# Patient Record
Sex: Male | Born: 1990 | Race: White | Hispanic: No | Marital: Single | Smoking: Never smoker
Health system: Southern US, Community
[De-identification: ages and names within clinical notes are randomized; demographics above are authoritative.]

## PROBLEM LIST (undated history)

## (undated) DIAGNOSIS — A048 Other specified bacterial intestinal infections: Secondary | ICD-10-CM

## (undated) DIAGNOSIS — IMO0002 Reserved for concepts with insufficient information to code with codable children: Secondary | ICD-10-CM

## (undated) DIAGNOSIS — J302 Other seasonal allergic rhinitis: Secondary | ICD-10-CM

## (undated) DIAGNOSIS — G809 Cerebral palsy, unspecified: Secondary | ICD-10-CM

## (undated) DIAGNOSIS — R569 Unspecified convulsions: Secondary | ICD-10-CM

## (undated) DIAGNOSIS — H913 Deaf nonspeaking, not elsewhere classified: Secondary | ICD-10-CM

## (undated) DIAGNOSIS — G40909 Epilepsy, unspecified, not intractable, without status epilepticus: Secondary | ICD-10-CM

## (undated) HISTORY — DX: Epilepsy, unspecified, not intractable, without status epilepticus: G40.909

## (undated) HISTORY — DX: Deaf nonspeaking, not elsewhere classified: H91.3

## (undated) HISTORY — DX: Other seasonal allergic rhinitis: J30.2

## (undated) HISTORY — DX: Reserved for concepts with insufficient information to code with codable children: IMO0002

## (undated) HISTORY — DX: Other specified bacterial intestinal infections: A04.8

## (undated) HISTORY — DX: Cerebral palsy, unspecified: G80.9

---
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
## 2012-02-05 LAB — STOOL CULTURE
Clostridium DiffCult: NEGATIVE
Culture, Stool(Salm/Shig/Campy&ECO157): NEGATIVE
Lactoferrin: NEGATIVE

## 2012-02-13 ENCOUNTER — Encounter: Payer: Self-pay | Admitting: Gastroenterology

## 2012-02-13 ENCOUNTER — Ambulatory Visit (INDEPENDENT_AMBULATORY_CARE_PROVIDER_SITE_OTHER): Payer: Medicaid Other | Admitting: Gastroenterology

## 2012-02-13 VITALS — BP 117/81 | HR 90 | Temp 98.4°F | Ht 67.0 in | Wt 163.6 lb

## 2012-02-13 DIAGNOSIS — R197 Diarrhea, unspecified: Secondary | ICD-10-CM

## 2012-02-13 MED ORDER — ALIGN 4 MG PO CAPS: 4.0000 mg | ORAL_CAPSULE | Freq: Every day | ORAL | Status: DC

## 2012-02-13 NOTE — Progress Notes (Signed)
Faxed to PCP

## 2012-02-13 NOTE — Patient Instructions (Addendum)
Please start Align one daily for four weeks to help replenish normal bacteria in the gut. If stool consistency and frequency do not improve or worsen, please call our office.  Diarrhea Diarrhea is watery poop (stool). The most common cause of diarrhea is a germ. Other causes include:  Food poisoning.  A reaction to medicine. HOME CARE   Drink clear fluids. This can stop you from losing too much body fluid (dehydration).  Drink enough fluids to keep your pee (urine) clear or pale yellow.  Avoid solid foods and dairy products until you start to feel better. Then start eating bland foods, such as:  Bananas.  Rice.  Crackers.  Applesauce.  Dry toast.  Avoid spicy foods, caffeine, and alcohol.  Your doctor may give medicine to help with cramps and watery poop. Take this as told. Avoid these medicines if you have a fever or blood in your poop.  Take your medicine as told. Finish them even if you start to feel better. GET HELP RIGHT AWAY IF:   The watery poop lasts longer than 3 days.  You have a fever.  Your baby is older than 3 months with a rectal temperature of 100.5 F (38.1 C) or higher for more than 1 day.  There is blood in your poop.  You start to throw up (vomit).  You lose too much fluid. MAKE SURE YOU:   Understand these instructions.  Will watch your condition.  Will get help right away if you are not doing well or get worse. Document Released: 09/20/2007 Document Revised: 06/26/2011 Document Reviewed: 09/20/2007 [facility] Patient Information 2013 [place], [place].
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
## 2012-02-13 NOTE — Assessment & Plan Note (Signed)
Recent acute diarrhea. Stool culture, lactoferrin, C.Diff Toxin titer were all negative. Patient given Flagyl and he completes today. This would cover Giardia if present as well. At this point, patient is actually improving. His stool consistency is no longer liquid. Only four BMs in the last 48 hours. No change in appetite. He is not on antidiarrheals. I suspect acute diarrhea related to infectious process such as protracted viral illness or bacterial. Doubt IBD. Cannot exclude post-infectious IBS. Discussed with patient's guardians. For now will watch and expect continued improvement. Add Align for four weeks. Slowly add back regular diet. Keep out of school this week, more for guardians request.   If he does not return to baseline OR symptoms worsen, they will call and let me know. For now, no need for further work-up but I explained to guardians, that if symptoms progress we would pursue work-up for other etiologies.

## 2012-02-20 ENCOUNTER — Telehealth: Payer: Self-pay | Admitting: *Deleted

## 2012-02-20 NOTE — Telephone Encounter (Signed)
Ms Harold Huber called today in regards to her son. He was doing well for the past few days with his medication, then today he has had 6 bowel movements. She wants you to be aware. They are keeping to his diet. She would like a call back.

## 2012-02-20 NOTE — Telephone Encounter (Signed)
Spoke with pts mother- pt had been doing well over the weekend. He had 2 bms on Friday, Saturday and Sunday, no bm on Monday. He went back to school today and the teacher called her and stated pt had 5 bm's at school, 2 episodes of diarrhea and 3 episodes of just soft stool. Pt had another bm after he got home and it was soft. They have been giving him align and following the diet- no milk, no juice. She did give him oatmeal this am for the first time since last week. Pt does not have a fever and mother stated he seemed to be acting ok and doesn't seem to be in pain.  She is aware LSL is out of the office and will not be back until tomorrow.   Please advise.

## 2012-02-22 ENCOUNTER — Telehealth: Payer: Self-pay | Admitting: Internal Medicine

## 2012-02-22 ENCOUNTER — Other Ambulatory Visit: Payer: Self-pay | Admitting: Gastroenterology

## 2012-02-22 ENCOUNTER — Other Ambulatory Visit: Payer: Self-pay

## 2012-02-22 DIAGNOSIS — R195 Other fecal abnormalities: Secondary | ICD-10-CM

## 2012-02-22 DIAGNOSIS — R197 Diarrhea, unspecified: Secondary | ICD-10-CM

## 2012-02-22 NOTE — Telephone Encounter (Signed)
pts family aware, container and orders at front desk.

## 2012-02-22 NOTE — Telephone Encounter (Signed)
Pt's mother has called the office again this morning. She has not heard back from message she had left yesterday. She is concerned about her son and would like a call back.

## 2012-02-22 NOTE — Telephone Encounter (Signed)
At this point, I would suggest C.Diff PCR and Giardia EIA. Once stool collected for studies, add Imodium 2mg  every am has long as he is having frequent or loose stools (but hold for constipation).   Continue align once daily.

## 2012-02-22 NOTE — Telephone Encounter (Signed)
See other phone note

## 2012-02-27 NOTE — Progress Notes (Signed)
Quick Note:  pts grandmother is aware. She stated he was doing well. Only having 1-2 formed stools a day, no diarrhea. She will call back if there are any problems. ______

## 2012-02-27 NOTE — Progress Notes (Signed)
Quick Note:  Last stool test came back negative.  Please get progress report on patient please. ______

## 2012-02-27 NOTE — Progress Notes (Signed)
Quick Note:  Tried to call pt's family- LMOM ______

## 2012-02-28 NOTE — Progress Notes (Signed)
Quick Note:  Great! ______ 

## 2012-03-04 ENCOUNTER — Telehealth: Payer: Self-pay | Admitting: Internal Medicine

## 2012-03-04 NOTE — Telephone Encounter (Signed)
OK to give school note

## 2012-03-04 NOTE — Telephone Encounter (Signed)
Pt's mother called to ask if LSL could do a school note to excuse absences from 11/6 to 11/13. Please advise. [phone]

## 2012-03-04 NOTE — Telephone Encounter (Signed)
Ginger did school note and left at the front desk for family to pick up.

## 2012-03-04 NOTE — Telephone Encounter (Signed)
Routing to LSL 

## 2012-04-17 DIAGNOSIS — A048 Other specified bacterial intestinal infections: Secondary | ICD-10-CM

## 2012-04-17 HISTORY — DX: Other specified bacterial intestinal infections: A04.8

## 2012-05-01 ENCOUNTER — Telehealth: Payer: Self-pay

## 2012-05-01 NOTE — Telephone Encounter (Signed)
I called grandmother and discussed symptoms with her today. Patient having recurrent diarrhea with some blood in stool according to caregivers at school. Very interesting to note that all of his life until recently he's required MiraLax in the setting of constipation to facilitate bowel function. Stool studies previously negative for infection.   I told his grandmother we need to see him in the office and, in all likelihood, need to set him up for a colonoscopy. We need to make sure that he does not have spurious diarrhea around an impaction, however.

## 2012-05-01 NOTE — Telephone Encounter (Signed)
pts grandma called- pt has been doing well for several weeks and they have not had to give him any immodium. Yesterday he had 4 stools at school. Today the school has called them and he has had 6 stools which started out loose and clumpy but ended with the last 2 stools were watery. The last 2 stools also had bright red blood in the water. Pt is not c/o pain, n/v. No fever. They have not made any changes to his diet or medications, no recent abx.   They want to know if there is anything we can give him or do they need to bring him in for an ov? Please advise.

## 2012-05-01 NOTE — Telephone Encounter (Signed)
Per RMR- he spoke with pts grandma. He wants pt to have an office visit with an extender between now and Monday to set up a tcs.   Please schedule pt.

## 2012-05-02 ENCOUNTER — Encounter (HOSPITAL_COMMUNITY): Payer: Self-pay | Admitting: Pharmacy Technician

## 2012-05-02 ENCOUNTER — Ambulatory Visit (INDEPENDENT_AMBULATORY_CARE_PROVIDER_SITE_OTHER): Payer: Medicaid Other | Admitting: Urgent Care

## 2012-05-02 ENCOUNTER — Encounter: Payer: Self-pay | Admitting: Urgent Care

## 2012-05-02 VITALS — BP 125/80 | HR 80 | Temp 98.4°F | Ht 67.0 in | Wt 160.0 lb

## 2012-05-02 DIAGNOSIS — K921 Melena: Secondary | ICD-10-CM

## 2012-05-02 MED ORDER — PEG 3350-KCL-NA BICARB-NACL 420 G PO SOLR: 4000.0000 mL | ORAL | Status: DC
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
## 2012-05-02 NOTE — Patient Instructions (Addendum)
Colonoscopy with Dr Jena Gauss as soon as possible Please follow instructions for 2 day prep Call if any problems with the prep If rectal bleeding recurs, please go to ER Rectal Bleeding Rectal bleeding is when blood passes out of the anus. It is usually a sign that something is wrong. It may not be serious, but it should always be evaluated. Rectal bleeding may present as bright red blood or extremely dark stools. The color may range from dark red or maroon to black (like tar). It is important that the cause of rectal bleeding be identified so treatment can be started and the problem corrected. CAUSES   Hemorrhoids. These are enlarged (dilated) blood vessels or veins in the anal or rectal area.  Fistulas. Theseare abnormal, burrowing channels that usually run from inside the rectum to the skin around the anus. They can bleed.  Anal fissures. This is a tear in the tissue of the anus. Bleeding occurs with bowel movements.  Diverticulosis. This is a condition in which pockets or sacs project from the bowel wall. Occasionally, the sacs can bleed.  Diverticulitis. Thisis an infection involving diverticulosis of the colon.  Proctitis and colitis. These are conditions in which the rectum, colon, or both, can become inflamed and pitted (ulcerated).  Polyps and cancer. Polyps are non-cancerous (benign) growths in the colon that may bleed. Certain types of polyps turn into cancer.  Protrusion of the rectum. Part of the rectum can project from the anus and bleed.  Certain medicines.  Intestinal infections.  Blood vessel abnormalities. HOME CARE INSTRUCTIONS  Eat a high-fiber diet to keep your stool soft.  Limit activity.  Drink enough fluids to keep your urine clear or pale yellow.  Warm baths may be useful to soothe rectal pain.  Follow up with your caregiver as directed. SEEK IMMEDIATE MEDICAL CARE IF:  You develop increased bleeding.  You have black or dark red stools.  You vomit  blood or material that looks like coffee grounds.  You have abdominal pain or tenderness.  You have a fever.  You feel weak, nauseous, or you faint.  You have severe rectal pain or you are unable to have a bowel movement. MAKE SURE YOU:  Understand these instructions.  Will watch your condition.  Will get help right away if you are not doing well or get worse. Document Released: 09/23/2001 Document Revised: 06/26/2011 Document Reviewed: 09/18/2010 [facility] Patient Information 2013 [place], [place].

## 2012-05-02 NOTE — Telephone Encounter (Signed)
Called grandmother this morning and patient will be coming today at 65 to see KJ

## 2012-05-06 NOTE — Assessment & Plan Note (Signed)
Harold Huber is a pleasant 22 y.o. deaf male with cerebral palsy who has hematochezia & diarrhea.  Hx diarrhea 2013 & stool studies benign, this resolved.  Baseline chronic constipation.  Differentials include benign anorectal source, diverticular bleed, ischemic colitis, Meckels diverticula, colorectal polyps or carcinoma.  Colonoscopy with Dr Jena Gauss as soon as possible.  I have discussed risks & benefits which include, but are not limited to, bleeding, infection, perforation & drug reaction.  The patient agrees with this plan & written consent will be obtained.    2 day prep Call if any problems with the prep If rectal bleeding recurs, please go to ER Rectal bleeding literature

## 2012-05-06 NOTE — Progress Notes (Signed)
Faxed to PCP

## 2012-05-15 ENCOUNTER — Encounter (HOSPITAL_COMMUNITY)
Admission: RE | Disposition: A | Discharge status: Home / Self Care | Payer: Self-pay | Source: Ambulatory Visit | Attending: Internal Medicine

## 2012-05-15 ENCOUNTER — Encounter (HOSPITAL_COMMUNITY): Payer: Self-pay | Admitting: *Deleted

## 2012-05-15 ENCOUNTER — Ambulatory Visit (HOSPITAL_COMMUNITY)
Admission: RE | Admit: 2012-05-15 | Discharge: 2012-05-15 | Disposition: A | Discharge status: Home / Self Care | Payer: Medicaid Other | Source: Ambulatory Visit | Attending: Internal Medicine | Admitting: Internal Medicine

## 2012-05-15 DIAGNOSIS — R197 Diarrhea, unspecified: Secondary | ICD-10-CM | POA: Insufficient documentation

## 2012-05-15 DIAGNOSIS — K625 Hemorrhage of anus and rectum: Secondary | ICD-10-CM

## 2012-05-15 DIAGNOSIS — K921 Melena: Secondary | ICD-10-CM

## 2012-05-15 DIAGNOSIS — K648 Other hemorrhoids: Secondary | ICD-10-CM | POA: Insufficient documentation

## 2012-05-15 HISTORY — DX: Unspecified convulsions: R56.9

## 2012-05-15 HISTORY — PX: COLONOSCOPY: SHX5424

## 2012-05-15 LAB — CBC
MCH: 30.4 pg (ref 26.0–34.0)
MCHC: 35 g/dL (ref 30.0–36.0)
MCV: 86.8 fL (ref 78.0–100.0)
Platelets: 309 10*3/uL (ref 150–400)

## 2012-05-15 LAB — CLOSTRIDIUM DIFFICILE BY PCR: Toxigenic C. Difficile by PCR: NEGATIVE

## 2012-05-15 SURGERY — COLONOSCOPY
Anesthesia: Moderate Sedation

## 2012-05-15 MED ORDER — SODIUM CHLORIDE 0.45 % IV SOLN
INTRAVENOUS | Status: DC
  Administered 2012-05-15: 10:00:00 via INTRAVENOUS

## 2012-05-15 MED ORDER — MEPERIDINE HCL 100 MG/ML IJ SOLN
INTRAMUSCULAR | Status: DC | PRN
  Administered 2012-05-15: 50 mg via INTRAVENOUS
  Administered 2012-05-15: 25 mg via INTRAVENOUS

## 2012-05-15 MED ORDER — STERILE WATER FOR IRRIGATION IR SOLN
Status: DC | PRN
  Administered 2012-05-15: 11:00:00

## 2012-05-15 MED ORDER — MEPERIDINE HCL 100 MG/ML IJ SOLN
INTRAMUSCULAR | Status: AC
  Filled 2012-05-15: qty 2

## 2012-05-15 MED ORDER — MIDAZOLAM HCL 5 MG/5ML IJ SOLN
INTRAMUSCULAR | Status: DC | PRN
  Administered 2012-05-15 (×2): 1 mg via INTRAVENOUS
  Administered 2012-05-15: 2 mg via INTRAVENOUS

## 2012-05-15 MED ORDER — ONDANSETRON HCL 4 MG/2ML IJ SOLN
INTRAMUSCULAR | Status: AC
  Filled 2012-05-15: qty 2

## 2012-05-15 MED ORDER — ONDANSETRON HCL 4 MG/2ML IJ SOLN
INTRAMUSCULAR | Status: DC | PRN
  Administered 2012-05-15: 4 mg via INTRAVENOUS

## 2012-05-15 MED ORDER — MIDAZOLAM HCL 5 MG/5ML IJ SOLN
INTRAMUSCULAR | Status: AC
  Filled 2012-05-15: qty 10
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
## 2012-05-15 NOTE — Op Note (Signed)
[facility] [address] [place], [address]   COLONOSCOPY PROCEDURE REPORT  PATIENT: Harold Huber, [place].  MR#:         [id number] BIRTHDATE: [birth year] , 21  yrs. old GENDER: Male ENDOSCOPIST: R.  Roetta Sessions, MD FACP FACG REFERRED BY:  Lilyan Punt, M.D. PROCEDURE DATE:  05/15/2012 PROCEDURE:     Ileocolonoscopy-diagnostic-with stool sampling  INDICATIONS: Rectal bleeding / diarrhea  INFORMED CONSENT:  The risks, benefits, alternatives and imponderables including but not limited to bleeding, perforation as well as the possibility of a missed lesion have been reviewed.  The potential for biopsy, lesion removal, etc. have also been discussed.  Questions have been answered.  All parties agreeable. Please see the history and physical in the medical record for more information.  MEDICATIONS: Versed 4 mg IV and Demerol 75 mg IV in divided doses. Zofran 4 mg IV  DESCRIPTION OF PROCEDURE:  After a digital rectal exam was performed, the Pentax Colonoscope [phone]  colonoscope was advanced from the anus through the rectum and colon to the area of the cecum, ileocecal valve and appendiceal orifice.  The cecum was deeply intubated.  These structures were well-seen and photographed for the record.  From the level of the cecum and ileocecal valve, the scope was slowly and cautiously withdrawn.  The mucosal surfaces were carefully surveyed utilizing scope tip deflection to facilitate fold flattening as needed.  The scope was pulled down into the rectum where a thorough examination including retroflexion was performed.    FINDINGS:  Adequate preparation. Internal hemorrhoids and anal papilla; otherwise, the remainder of the rectal mucosa appeared normal. The colonic mucosa appeared normal to the cecum. The distal 10 cm of terminal ileal mucosa appeared normal.  THERAPEUTIC / DIAGNOSTIC MANEUVERS PERFORMED:  Stool residue suctioned out for repeat microbiology  studies.  COMPLICATIONS: None  CECAL WITHDRAWAL TIME:  9 minutes  IMPRESSION:  Anal papilla and Internal hemorrhoids; Otherwise, normal rectum, colon and terminal ileum. A missed infectious process not excluded. Also, transient fecal impaction with spurious diarrhea with secondary                           anorectal bleeding also not out of the question.   RECOMMENDATIONS: Followup on pending stool studies. Ten-day course of anus all suppositories. CBC today. Office visit with [place] in 4 weeks.   _______________________________ eSigned:  R. Roetta Sessions, MD FACP [facility] 05/15/2012 11:17 AM   CC:    PATIENT NAME:  Harold Huber, [place]. MR#: [id number]

## 2012-05-15 NOTE — Interval H&P Note (Signed)
History and Physical Interval Note:  05/15/2012 10:18 AM  Harold Huber  has presented today for surgery, with the diagnosis of RECTAL BLEED  The various methods of treatment have been discussed with the patient and family. After consideration of risks, benefits and other options for treatment, the patient has consented to  Procedure(s) (LRB) with comments: COLONOSCOPY (N/A) - 10:30 as a surgical intervention .  The patient's history has been reviewed, patient examined, no change in status, stable for surgery.  I have reviewed the patient's chart and labs.  Questions were answered to the patient's satisfaction.     [name]

## 2012-05-16 LAB — FECAL LACTOFERRIN, QUANT: Fecal Lactoferrin: NEGATIVE

## 2012-05-17 ENCOUNTER — Encounter (HOSPITAL_COMMUNITY): Payer: Self-pay | Admitting: Internal Medicine

## 2012-05-19 LAB — STOOL CULTURE

## 2012-06-12 ENCOUNTER — Encounter: Payer: Self-pay | Admitting: Internal Medicine

## 2012-06-13 ENCOUNTER — Ambulatory Visit (INDEPENDENT_AMBULATORY_CARE_PROVIDER_SITE_OTHER): Payer: Medicaid Other | Admitting: Urgent Care

## 2012-06-13 ENCOUNTER — Encounter: Payer: Self-pay | Admitting: Urgent Care

## 2012-06-13 ENCOUNTER — Other Ambulatory Visit: Payer: Self-pay | Admitting: Urgent Care

## 2012-06-13 VITALS — BP 107/57 | HR 80 | Temp 98.0°F | Ht 67.0 in | Wt 165.8 lb

## 2012-06-13 DIAGNOSIS — K921 Melena: Secondary | ICD-10-CM

## 2012-06-13 DIAGNOSIS — R197 Diarrhea, unspecified: Secondary | ICD-10-CM

## 2012-06-13 NOTE — Patient Instructions (Addendum)
Please get your labs as soon as possible.  We will call you with results. Out of school note until tomorrow Continued digestive advantage daily You may use Imodium 2 mg each morning if needed for diarrhea Call if diarrhea worsens, fever, or signs of dehydration
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
## 2012-06-14 ENCOUNTER — Telehealth: Payer: Self-pay | Admitting: Internal Medicine

## 2012-06-14 LAB — COMPREHENSIVE METABOLIC PANEL
ALT: 65 U/L — ABNORMAL HIGH (ref 0–53)
AST: 44 U/L — ABNORMAL HIGH (ref 0–37)
Albumin: 5.1 g/dL (ref 3.5–5.2)
CO2: 28 mEq/L (ref 19–32)
Calcium: 10.7 mg/dL — ABNORMAL HIGH (ref 8.4–10.5)
Chloride: 101 mEq/L (ref 96–112)
Creat: 0.65 mg/dL (ref 0.50–1.35)
Potassium: 3.8 mEq/L (ref 3.5–5.3)
Total Protein: 7.9 g/dL (ref 6.0–8.3)

## 2012-06-14 LAB — IGA: IgA: 127 mg/dL (ref 68–379)

## 2012-06-14 LAB — TSH: TSH: 1.936 u[IU]/mL (ref 0.350–4.500)

## 2012-06-14 NOTE — Telephone Encounter (Signed)
Pt's grandparents were calling to see if lab results are back. Call [phone]

## 2012-06-14 NOTE — Assessment & Plan Note (Signed)
Intermittent diarrhea likely due to acute enteritis, medication effect, irritable bowel syndrome, celiac disease,small bowel bacterial overgrowth vs overflow incontinence  TTG IgA,IgA,tegretol level Out of school note until tomorrow Continue digestive advantage daily You may use Imodium 2 mg each morning if needed for diarrhea Call if diarrhea worsens, fever, or signs of dehydration

## 2012-06-14 NOTE — Telephone Encounter (Signed)
Called and informed Mrs. Gary Fleet that not all of pts blood work has come back yet and we would call when it did.

## 2012-06-17 NOTE — Progress Notes (Signed)
Faxed to PCP

## 2012-06-17 NOTE — Progress Notes (Signed)
Quick Note:  Please let pt's grandparents know labs are normal including medicine level,thyroid, electrolytes,& no evidence of celiac disease. Liver tests very mildly elevated.  We will need to check Hepatitis C ab & Hep BsAg. Also, how is diarrhea? If persistent,will need CT A/P w/IV/oral contrast. If diarrhea resolved, just needs ABD ultrasound (not CT) [id number] LFTS. Thanks UJ:[id number],NWGNF, MD  ______

## 2012-06-18 ENCOUNTER — Other Ambulatory Visit: Payer: Self-pay | Admitting: Urgent Care

## 2012-06-18 LAB — HEPATITIS B SURFACE ANTIGEN: Hepatitis B Surface Ag: NEGATIVE

## 2012-06-18 NOTE — Progress Notes (Signed)
Quick Note:  Mrs.Hundley- pts grandma is aware. She stated he has been doing ok, only having 1 to 2 soft bowel movements daily, except for Saturday, he had 5 soft bowel movements. No runny diarrhea. She said it was ok to set up ultrasound. Leighann, please schedule U/S. Dawn, please cc pcp ______

## 2012-06-18 NOTE — Progress Notes (Signed)
Quick Note:  Called [facility]- spoke with Monique- added hepatitis labs. ______

## 2012-06-18 NOTE — Progress Notes (Signed)
I spoke to Harold Huber and she is aware that Harold Huber is scheduled for Abd U/S on Friday March 7th at 9:00 am

## 2012-06-19 NOTE — Progress Notes (Signed)
Quick Note:  Await ultrasound ZO:XWRUEA,VWUJW, MD  ______

## 2012-06-19 NOTE — Progress Notes (Signed)
Results faxed to PCP 

## 2012-06-20 NOTE — Progress Notes (Signed)
Results faxed to PCP 

## 2012-06-21 ENCOUNTER — Ambulatory Visit (HOSPITAL_COMMUNITY): Payer: Medicaid Other

## 2012-06-22 ENCOUNTER — Encounter: Payer: Self-pay | Admitting: *Deleted

## 2012-06-25 ENCOUNTER — Ambulatory Visit (HOSPITAL_COMMUNITY)
Admission: RE | Admit: 2012-06-25 | Discharge: 2012-06-25 | Disposition: A | Discharge status: Home / Self Care | Payer: Medicaid Other | Source: Ambulatory Visit | Attending: Urgent Care | Admitting: Urgent Care

## 2012-06-25 ENCOUNTER — Other Ambulatory Visit (HOSPITAL_COMMUNITY): Payer: Medicaid Other

## 2012-06-25 DIAGNOSIS — R160 Hepatomegaly, not elsewhere classified: Secondary | ICD-10-CM | POA: Insufficient documentation

## 2012-06-25 DIAGNOSIS — R7989 Other specified abnormal findings of blood chemistry: Secondary | ICD-10-CM | POA: Insufficient documentation

## 2012-06-26 NOTE — Progress Notes (Signed)
Quick Note:  Please let patient's grandparents know his labs were normal. Ultrasound shows fatty liver. Instructions for fatty liver: Recommend 1-2# weight loss per week until ideal body weight through exercise & diet. Low fat/cholesterol diet.  Avoid sweets, sodas, fruit juices, sweetened beverages like tea, etc. Gradually increase exercise from 15 min daily up to 1 hr per day 5 days/week. Thanks CC: LUKING,SCOTT, MD     ______

## 2012-06-26 NOTE — Progress Notes (Signed)
Quick Note:  See ultrasound CC: LUKING,SCOTT, MD  ______

## 2012-06-26 NOTE — Progress Notes (Signed)
Labs and ultrasound faxed to PCP

## 2012-06-27 NOTE — Progress Notes (Signed)
Quick Note:  pts grandmother is aware. She will watch his diet and exercise him as much as he is able.  Dawn, please cc Dr. Gerda Diss. ______

## 2012-06-27 NOTE — Progress Notes (Signed)
Faxed to PCP

## 2012-06-28 NOTE — Progress Notes (Signed)
Quick Note:    Addressed  ______

## 2012-07-31 ENCOUNTER — Encounter: Payer: Self-pay | Admitting: Family Medicine

## 2012-07-31 ENCOUNTER — Ambulatory Visit (INDEPENDENT_AMBULATORY_CARE_PROVIDER_SITE_OTHER): Payer: Medicaid Other | Admitting: Family Medicine

## 2012-07-31 VITALS — Wt 165.0 lb

## 2012-07-31 DIAGNOSIS — L0390 Cellulitis, unspecified: Secondary | ICD-10-CM

## 2012-07-31 MED ORDER — CEFPROZIL 500 MG PO TABS: 500.0000 mg | ORAL_TABLET | Freq: Two times a day (BID) | ORAL | Status: DC

## 2012-07-31 MED ORDER — MUPIROCIN 2 % EX OINT: TOPICAL_OINTMENT | CUTANEOUS | Status: DC

## 2012-07-31 NOTE — Progress Notes (Signed)
  Subjective:    Patient ID: Harold Huber, male    DOB: 15-Jul-1990, 22 y.o.   MRN: [id number]  HPI Blister on L foot for several days, now withpain and redness. No fever, no vomiting   Review of Systemsneg, pulm, neg GI, neg fever     Objective:   Physical Exam  Chest CTA, CV- RRR, L foot large blister 2 by 4 inches, slight cellulitis      Assessment & Plan:  Cefzil, bactroban, home health check once a week for 2 to 3 weeks

## 2012-09-13 ENCOUNTER — Encounter: Payer: Self-pay | Admitting: Family Medicine

## 2012-09-13 ENCOUNTER — Ambulatory Visit (INDEPENDENT_AMBULATORY_CARE_PROVIDER_SITE_OTHER): Payer: Medicaid Other | Admitting: Family Medicine

## 2012-09-13 VITALS — Temp 97.8°F

## 2012-09-13 DIAGNOSIS — R197 Diarrhea, unspecified: Secondary | ICD-10-CM

## 2012-09-13 MED ORDER — AZITHROMYCIN 250 MG PO TABS: ORAL_TABLET | ORAL | Status: DC

## 2012-09-13 MED ORDER — METRONIDAZOLE 500 MG PO TABS: 500.0000 mg | ORAL_TABLET | Freq: Three times a day (TID) | ORAL | Status: DC

## 2012-09-13 NOTE — Patient Instructions (Signed)
Use zithromax first  Come Weds you can start the metronidazole, one three times a day  We will set up the appointment with Dr. Kendell Bane

## 2012-09-13 NOTE — Progress Notes (Signed)
  Subjective:    Patient ID: Harold Huber, male    DOB: 12-23-1990, 22 y.o.   MRN: [id number]  Cough This is a new problem. The current episode started in the past 7 days. The problem has been gradually worsening. The problem occurs constantly. The cough is non-productive. Pertinent negatives include no fever or rhinorrhea. Associated symptoms comments: congestion. Nothing aggravates the symptoms. He has tried OTC cough suppressant for the symptoms. The treatment provided no relief.      Review of Systems  Constitutional: Negative for fever and fatigue.  HENT: Negative for congestion and rhinorrhea.   Respiratory: Positive for cough. Negative for chest tightness.   Gastrointestinal: Positive for diarrhea.       Objective:   Physical Exam  Vitals reviewed. Constitutional: He appears well-developed.  HENT:  Head: Atraumatic.  Right Ear: External ear normal.  Left Ear: External ear normal.  Nose: Nose normal.  Mouth/Throat: No oropharyngeal exudate.  Neck: No thyromegaly present.  Cardiovascular: Normal rate, regular rhythm and normal heart sounds.   Pulmonary/Chest: Effort normal and breath sounds normal.  Abdominal: Soft.  Lymphadenopathy:    He has no cervical adenopathy.          Assessment & Plan:  Diarrhea- persistant, see GI, theve seen him before URI zpack Flagyl therapeutic trial times 7 day for diarrhea

## 2012-10-04 ENCOUNTER — Ambulatory Visit (INDEPENDENT_AMBULATORY_CARE_PROVIDER_SITE_OTHER): Payer: Medicaid Other | Admitting: Gastroenterology

## 2012-10-04 ENCOUNTER — Encounter: Payer: Self-pay | Admitting: Gastroenterology

## 2012-10-04 VITALS — BP 120/77 | HR 89 | Temp 97.9°F | Ht 67.0 in | Wt 164.6 lb

## 2012-10-04 DIAGNOSIS — R945 Abnormal results of liver function studies: Secondary | ICD-10-CM | POA: Insufficient documentation

## 2012-10-04 DIAGNOSIS — R7989 Other specified abnormal findings of blood chemistry: Secondary | ICD-10-CM | POA: Insufficient documentation

## 2012-10-04 DIAGNOSIS — R932 Abnormal findings on diagnostic imaging of liver and biliary tract: Secondary | ICD-10-CM | POA: Insufficient documentation

## 2012-10-04 DIAGNOSIS — R197 Diarrhea, unspecified: Secondary | ICD-10-CM

## 2012-10-04 MED ORDER — HYOSCYAMINE SULFATE 0.125 MG SL SUBL: 0.1250 mg | SUBLINGUAL_TABLET | SUBLINGUAL | Status: DC | PRN

## 2012-10-04 NOTE — Assessment & Plan Note (Addendum)
8-9 month h/o intermittent diarrhea with unremarkable work-up including stool studies, colonoscopy, celiac labs. I don't suspect spurious diarrhea based on history of bowel function as described by grandparents. He has had multiple rounds of antibiotics recently but did not respond to empiric course of Flagyl.   Stool for GI pathogen test.  Trial of Levsin SL QID prn. Potential side effects explains to grandparents. Hold for constipation.  Continue Digestive Advantage. Slowly add fiber back into diet.  Keep stool diary.  We will touch base with family when stool test are back to see if any improvement.

## 2012-10-04 NOTE — Patient Instructions (Addendum)
1. Trial of Levsin, place one on tongue up to four times daily for loose stool. Hold for constipation. Prescription sent to your pharmacy. 2. Collect stool for GI Pathogen Panel. 3. Gradually add back fiber to his diet. Keep diary of number of stools per day, consistency of stools (loose/soft/formed) and what food he is eating.

## 2012-10-04 NOTE — Progress Notes (Signed)
Primary Care Physician: Lilyan Punt, MD  Primary Gastroenterologist:  Roetta Sessions, MD   Chief Complaint  Patient presents with  . Follow-up  . Diarrhea    HPI: Harold Huber is a 22 y.o. male here for ongoing problems with diarrhea. Last seen in our office in 05/2012. He has h/o chronic intermittent diarrhea with incontinence, hematochezia. Colonoscopy in 04/2012 showed anal papilla and internal hemorrhoids. Celiac labs negative. He has h/o mildly elevated AST/ALT. HCV Ab and Hep B surface Antigen negative. Abd u/s showed fatty liver. Stool studies negative at least twice.   History provided by guardians/grandparents/patient's nurse. Patient continues to have frequent diarrhea. Prior to when these symptoms started last fall, he had to take Miralax every day. Now they are afraid to give him fiber due to the diarrhea.  BM 3-5 per day. Mostly soft, rarely solid. Some watery/runny as well. No blood in stool anymore. Graduated from school, now has Psychiatric nurse. Some gripy pain before BMs, whines sometimes. No vomiting. Eats well. Diarrhea real bad with veggies. No lactose intolerance.  Z-pak on 09/13/12 for URI. Flagyl for diarrhea. Given Cefzil in 07/2012 for cellulitis.  Takes approximately 0.5mg  Imodium as needed but not daily. Doesn't seem to help much. They are afraid to cause constipation.   Since last visit here, they spoke to his neurologist about the carbamazepine. He has fatty liver and elevated AST/ALT but they report the neurologist did not feel the carbamazepine was the culprit.    Current Outpatient Prescriptions  Medication Sig Dispense Refill  . calcium carbonate (OS-CAL) 600 MG TABS Take 600 mg by mouth 2 (two) times daily with a meal.      . carbamazepine (CARBATROL) 300 MG 12 hr capsule Take 600 mg by mouth 2 (two) times daily.      . cholecalciferol (VITAMIN D) 400 UNITS TABS Take 400 Units by mouth daily.      Marland Kitchen loperamide (IMODIUM) 1 MG/5ML solution Take 2 mg  by mouth daily as needed for diarrhea or loose stools.      Marland Kitchen loratadine (CLARITIN) 10 MG tablet Take 10 mg by mouth daily. PRN      . Probiotic Product (PROBIOTIC PO) Take by mouth.       No current facility-administered medications for this visit.    Allergies as of 10/04/2012 - Review Complete 10/04/2012  Allergen Reaction Noted  . Ace inhibitors  05/15/2012  . Codeine  09/13/2012  . Other  02/13/2012    ROS: Unobtainable from patient.     Physical Examination:   BP 120/77  Pulse 89  Temp(Src) 97.9 F (36.6 C) (Oral)  Ht 5\' 7"  (1.702 m)  Wt 164 lb 9.6 oz (74.662 kg)  BMI 25.77 kg/m2  General: Well-nourished, well-developed in no acute distress. Accompanied by [facility] staff, grandparents.  Eyes: No icterus. Mouth: Oropharyngeal mucosa moist and pink , no lesions erythema or exudate. Lungs: Clear to auscultation bilaterally.  Heart: Regular rate and rhythm, no murmurs rubs or gallops.  Abdomen: Bowel sounds are normal, nontender, nondistended, no hepatosplenomegaly or masses, no abdominal bruits or hernia , no rebound or guarding.   Extremities: No lower extremity edema. No clubbing or deformities. Neuro: Alert and oriented x 4   Skin: Warm and dry, no jaundice.   Psych: Alert and cooperative. Nonverbal.  Labs:  Lab Results  Component Value Date   CREATININE 0.65 06/13/2012   BUN 9 06/13/2012   NA 141 06/13/2012   K 3.8 06/13/2012   CL 101  06/13/2012   CO2 28 06/13/2012   Lab Results  Component Value Date   ALT 65* 06/13/2012   AST 44* 06/13/2012   ALKPHOS 109 06/13/2012   BILITOT 0.5 06/13/2012   Lab Results  Component Value Date   WBC 9.5 05/15/2012   HGB 15.7 05/15/2012   HCT 44.8 05/15/2012   MCV 86.8 05/15/2012   PLT 309 05/15/2012   Lab Results  Component Value Date   TSH 1.936 06/13/2012    Imaging Studies: No results found.

## 2012-10-04 NOTE — Assessment & Plan Note (Signed)
Concerning findings of mildly enlarged fatty appearing liver on U/S and mild AST/ALT elevation. Patient falls within upper limits of normal for BMI. He has no DM, no etoh use. I would be concerned about Carbamazepine effect. To discuss with Dr. Jena Gauss. Further recommendations to follow.

## 2012-10-07 NOTE — Progress Notes (Signed)
Cc PCP 

## 2012-10-29 ENCOUNTER — Telehealth: Payer: Self-pay | Admitting: Gastroenterology

## 2012-10-29 DIAGNOSIS — R945 Abnormal results of liver function studies: Secondary | ICD-10-CM

## 2012-10-29 MED ORDER — OMEPRAZOLE 20 MG PO CPDR: 20.0000 mg | DELAYED_RELEASE_CAPSULE | Freq: Two times a day (BID) | ORAL | Status: DC

## 2012-10-29 MED ORDER — BIS SUBCIT-METRONID-TETRACYC 140-125-125 MG PO CAPS: 3.0000 | ORAL_CAPSULE | Freq: Three times a day (TID) | ORAL | Status: DC

## 2012-10-29 NOTE — Telephone Encounter (Signed)
Pt's grandmother is aware of results and know to start the Rx. Told her that we was going to talk with Dr.Luking about his liver and get back with them.I am going to mail out some information on H.pylori.

## 2012-10-29 NOTE — Telephone Encounter (Signed)
GI pathogen panel reviewed. Positive H. pylori. Unlikely etiology for chronic diarrhea. Also heme positive.  Pylera and omeprazole sent to pharmacy for H. Pylori. Clarithromycin with Prevpac interferes with Carbatrol.  Also discussed abnormal LFTs and fatty liver/hepatomegaly with Dr. Jena Gauss. Previously viral markers were negative. However now he needs more detailed workup.  Lab orders entered. LFTs , iron and TIBC, ferritin, ceruloplasmin, immunoglobulins, ANA, AMA, anti-smooth muscle.  If all of these are unremarkable, I will discuss further with Dr. Lilyan Punt regarding possibility that Carbatrol may be causing liver issues.   OV with RMR nonurgent. How is patient doing?

## 2012-11-05 LAB — HEPATIC FUNCTION PANEL
ALT: 70 U/L — ABNORMAL HIGH (ref 0–53)
Alkaline Phosphatase: 122 U/L — ABNORMAL HIGH (ref 39–117)
Indirect Bilirubin: 0.3 mg/dL (ref 0.0–0.9)

## 2012-11-05 LAB — IRON AND TIBC: UIBC: 195 ug/dL (ref 125–400)

## 2012-11-06 LAB — IGG, IGA, IGM
IgA: 131 mg/dL (ref 68–379)
IgG (Immunoglobin G), Serum: 956 mg/dL (ref 650–1600)
IgM, Serum: 88 mg/dL (ref 41–251)

## 2012-11-06 LAB — MITOCHONDRIAL/SMOOTH MUSCLE AB PNL: Mitochondrial M2 Ab, IgG: 0.33 (ref <0.91)

## 2012-11-06 NOTE — Telephone Encounter (Signed)
Quick Note:  His ceruloplasmin is low normal. LFTs remain elevated but stable.   Suggest 24 hour urinary copper to rule out Wilson's disease. Can a serum copper level be added on to current labs? ______

## 2012-11-07 ENCOUNTER — Other Ambulatory Visit: Payer: Self-pay | Admitting: Gastroenterology

## 2012-11-07 ENCOUNTER — Other Ambulatory Visit: Payer: Self-pay

## 2012-11-07 DIAGNOSIS — R945 Abnormal results of liver function studies: Secondary | ICD-10-CM

## 2012-11-12 LAB — COPPER, SERUM: Copper: 73 ug/dL (ref 70–175)

## 2012-11-15 NOTE — Progress Notes (Signed)
Quick Note:  Tried to call grandparents, LMOM with normal results and will call back if any further recommendations. ______

## 2012-11-15 NOTE — Progress Notes (Signed)
Quick Note:  Normal serum copper levels.  Routing to LSL for further recommendations. ______

## 2012-11-20 ENCOUNTER — Encounter: Payer: Self-pay | Admitting: Internal Medicine

## 2012-11-20 NOTE — Telephone Encounter (Signed)
Looks like Darl Pikes never received this to make nonurgent OV with RMR only.

## 2012-11-20 NOTE — Telephone Encounter (Signed)
OV made of 9/17 at 10 with RMR and appt card has been mailed

## 2013-01-01 ENCOUNTER — Ambulatory Visit (INDEPENDENT_AMBULATORY_CARE_PROVIDER_SITE_OTHER): Payer: Medicaid Other | Admitting: Internal Medicine

## 2013-01-01 ENCOUNTER — Encounter: Payer: Self-pay | Admitting: Internal Medicine

## 2013-01-01 VITALS — BP 116/69 | HR 106 | Temp 97.2°F | Ht 67.0 in | Wt 165.6 lb

## 2013-01-01 DIAGNOSIS — R197 Diarrhea, unspecified: Secondary | ICD-10-CM

## 2013-01-01 DIAGNOSIS — R7989 Other specified abnormal findings of blood chemistry: Secondary | ICD-10-CM
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
## 2013-01-01 NOTE — Patient Instructions (Addendum)
Continue [employer]  Hepatic function profile  Further recommendations to follow

## 2013-01-02 LAB — HEPATIC FUNCTION PANEL
ALT: 40 U/L (ref 0–53)
Bilirubin, Direct: 0.1 mg/dL (ref 0.0–0.3)
Total Protein: 7.9 g/dL (ref 6.0–8.3)

## 2013-01-03 ENCOUNTER — Other Ambulatory Visit: Payer: Self-pay | Admitting: Internal Medicine

## 2013-01-03 DIAGNOSIS — R945 Abnormal results of liver function studies: Secondary | ICD-10-CM

## 2013-02-06 ENCOUNTER — Encounter: Payer: Self-pay | Admitting: Family Medicine

## 2013-02-06 ENCOUNTER — Ambulatory Visit (INDEPENDENT_AMBULATORY_CARE_PROVIDER_SITE_OTHER): Payer: Medicaid Other | Admitting: Family Medicine

## 2013-02-06 VITALS — BP 114/76 | Ht 67.0 in | Wt 164.0 lb

## 2013-02-06 DIAGNOSIS — R5381 Other malaise: Secondary | ICD-10-CM

## 2013-02-06 DIAGNOSIS — R5383 Other fatigue: Secondary | ICD-10-CM

## 2013-02-06 DIAGNOSIS — Z0000 Encounter for general adult medical examination without abnormal findings: Secondary | ICD-10-CM

## 2013-02-06 DIAGNOSIS — Z00129 Encounter for routine child health examination without abnormal findings: Secondary | ICD-10-CM

## 2013-02-06 LAB — GLUCOSE, POCT (MANUAL RESULT ENTRY): POC Glucose: 89 mg/dl (ref 70–99)

## 2013-02-06 NOTE — Progress Notes (Signed)
  Subjective:    Patient ID: Harold Huber, male    DOB: November 16, 1990, 22 y.o.   MRN: [id number]  HPI Patient is here today for a annual physical.  Great grandparents state patient is doing well.  They have no concerns.  The patient comes in today for a wellness visit.  A review of their health history was completed.  A review of medications was also completed. Any necessary refills were discussed. Sensible healthy diet was discussed. Importance of minimizing excessive salt and carbohydrates was also discussed. Safety was stressed including driving, activities at work and at home where applicable. Importance of regular physical activity for overall health was discussed. Preventative measures appropriate for age were discussed. Time was spent with the patient discussing any concerns they have about their well-being.    Review of Systems Patient unable to do review of systems because of his cerebral palsy but family was able to assist. No chest pain shortness breath no nausea vomiting diarrhea    Objective:   Physical Exam Eardrums normal throat is normal neck supple lungs are clear hearts regular abdomen soft patient nonverbal cannot follow commands patient is disabled  [place] grandparents do excellent job watching out for him. He does have a case manager who also checks in on     Assessment & Plan:  Disabled/developmental delay/cerebral palsy/possible autism-stable health. Lab work in the spring time. Continue current measures  Has history of fatty liver but most recent liver enzymes were normal

## 2013-04-29 IMAGING — US US ABDOMEN COMPLETE
1 series · 13 of 25 positions shown · non-contrast
Comparison: none

Abdominal ultrasound
HISTORY: Elevated liver enzymes

[Series 1: us abdomen complete · 0.28mm/px · 13 of 96 slices shown]
[im 1/96]
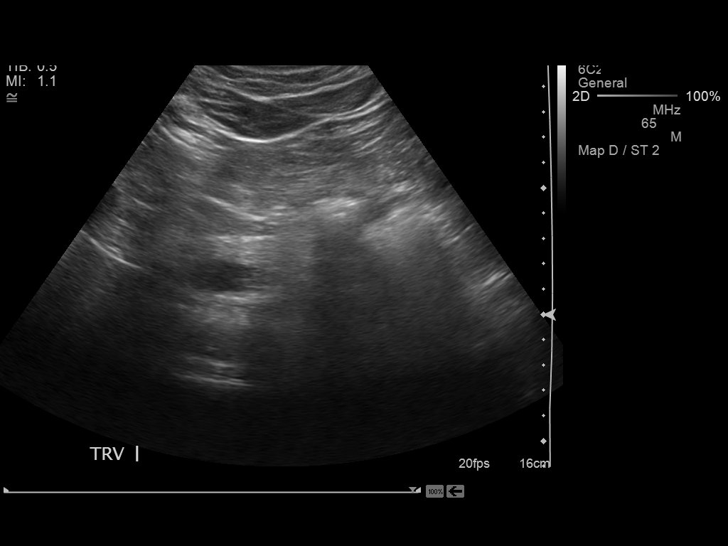
[im 8/96]
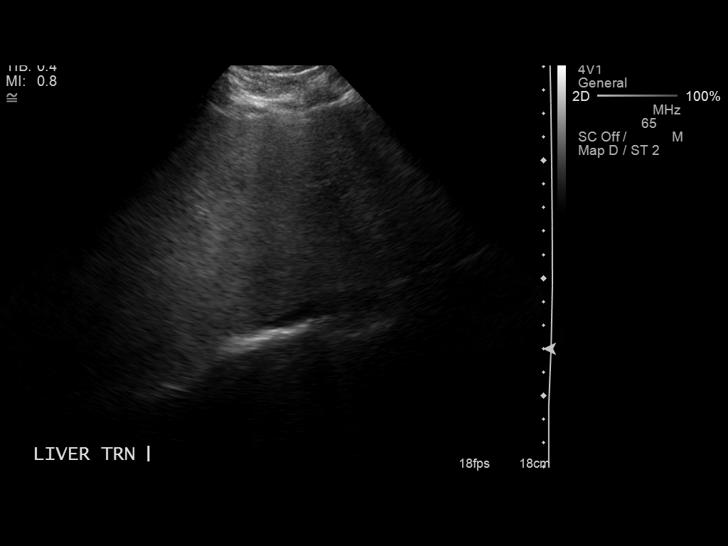
[im 16/96]
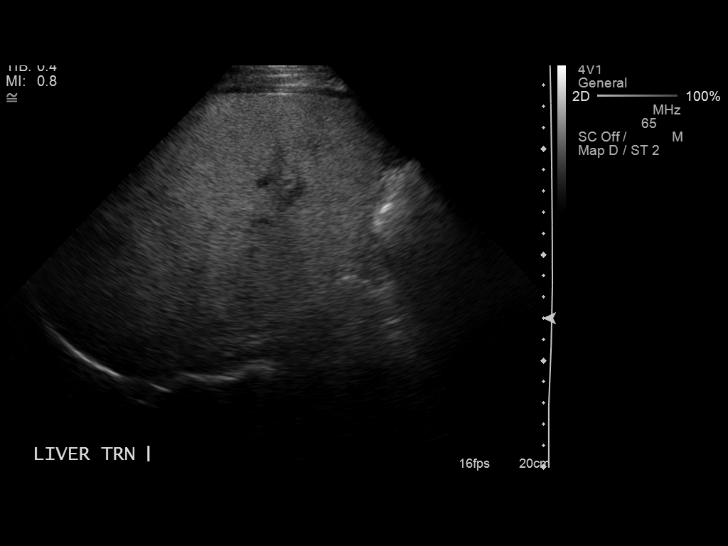
[im 24/96]
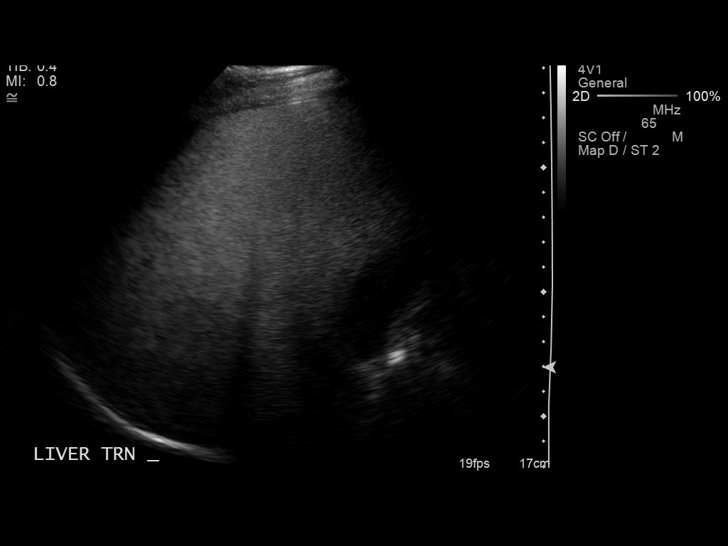
[im 32/96]
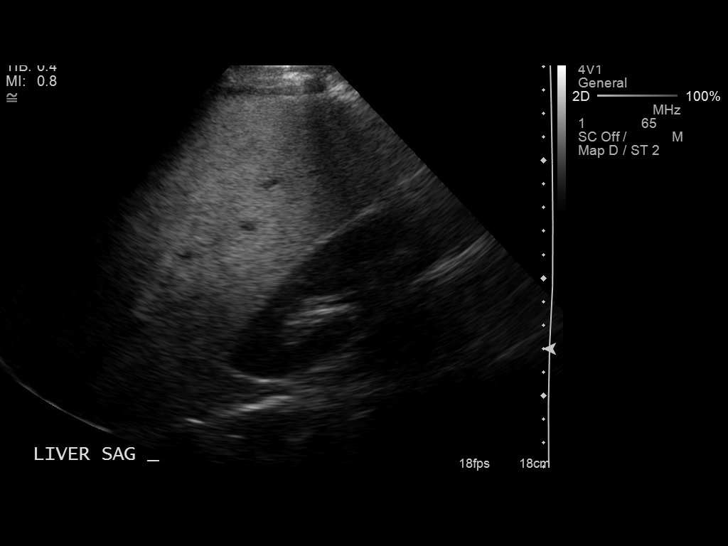
[im 40/96]
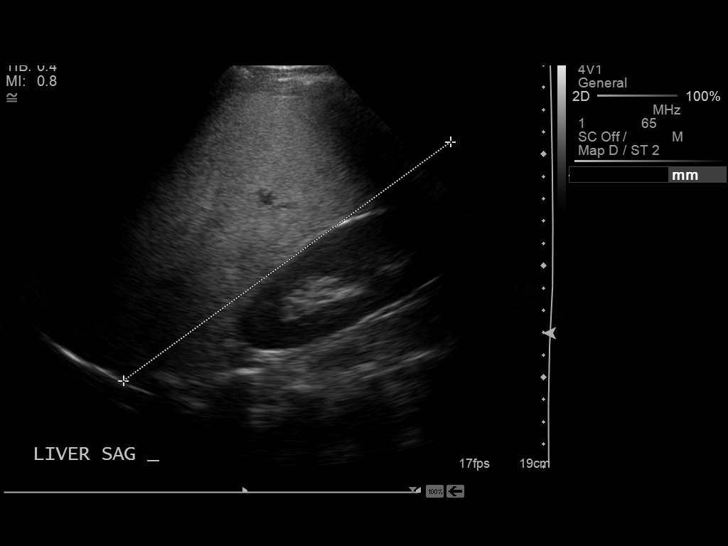
[im 48/96]
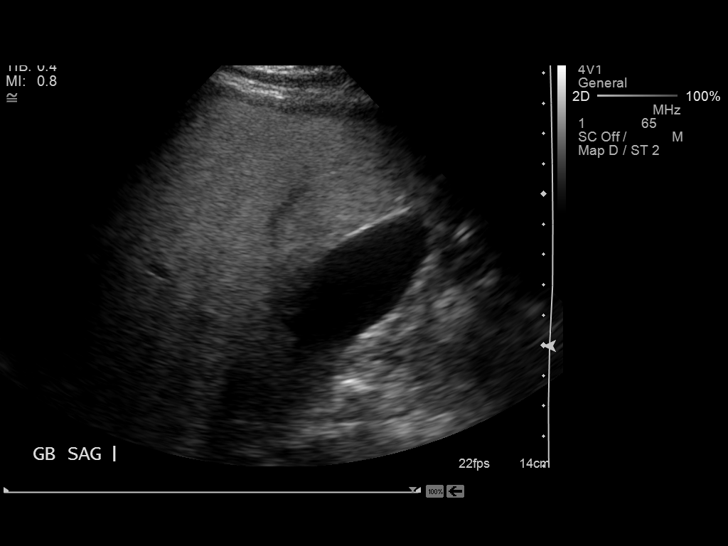
[im 56/96]
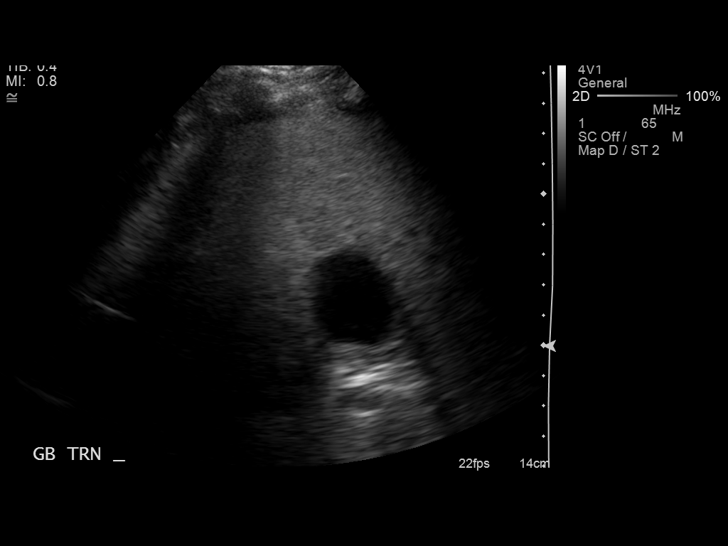
[im 64/96]
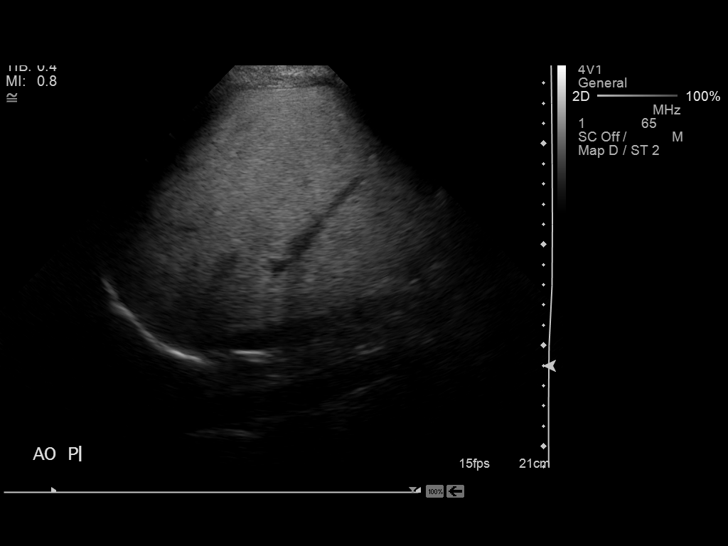
[im 72/96]
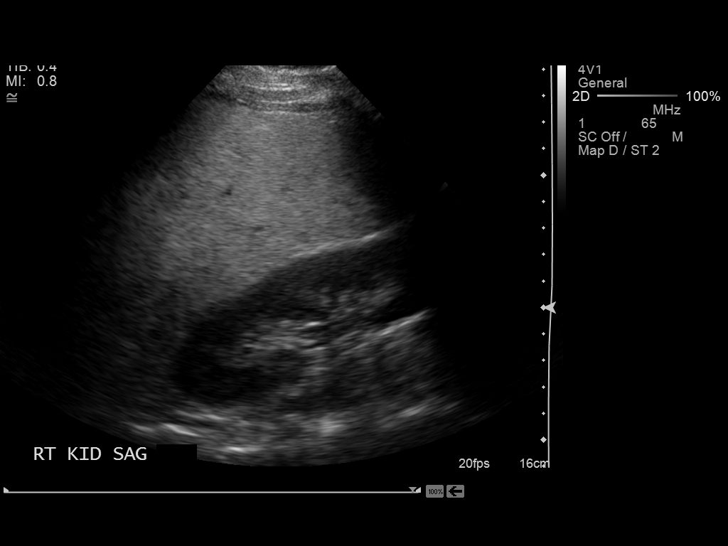
[im 80/96]
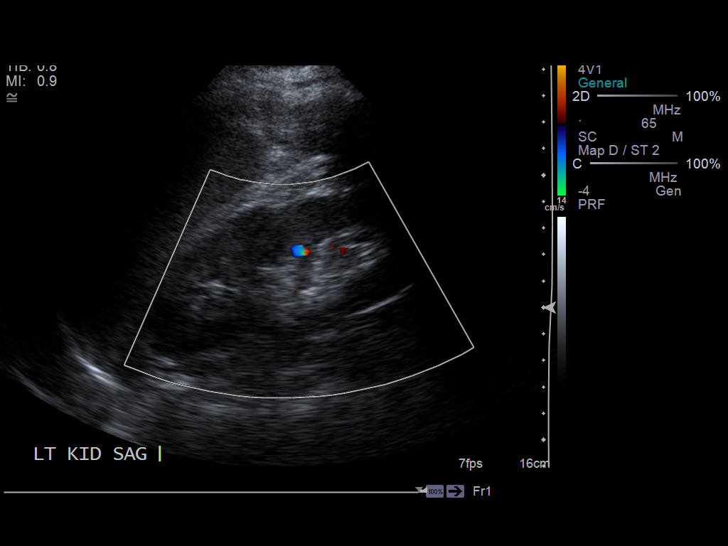
[im 88/96]
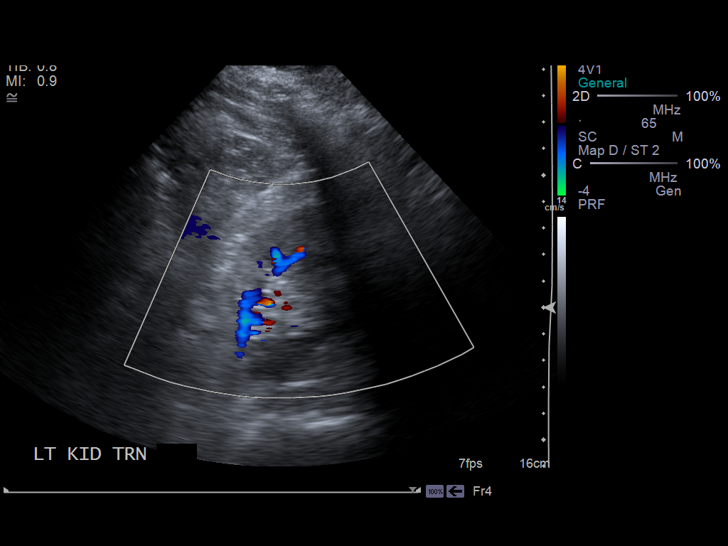
[im 96/96]
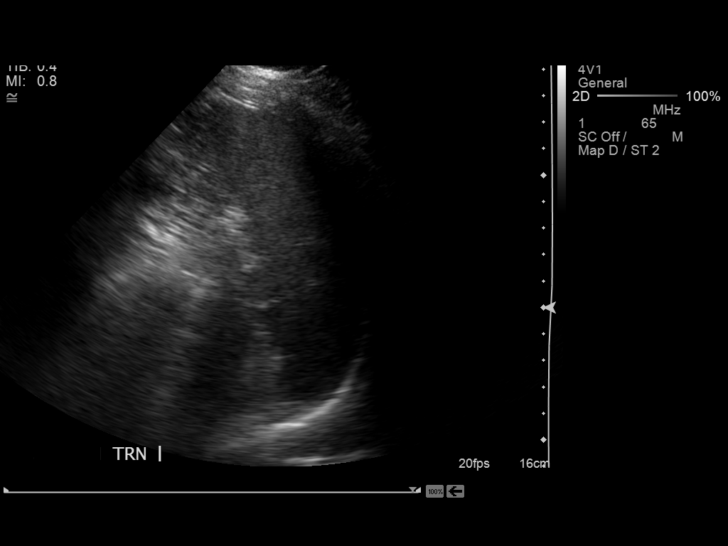

[13 of 25 positions shown; findings below may reference images not displayed]

FINDINGS: Gallbladder is visualized in multiple projections.
There are no gallstones, gallbladder wall thickening, or
pericholecystic fluid collection.  There is no intrahepatic, common
hepatic, or common bile duct dilatation.  Pancreas is partially
visualized and partially obscured by gas; visualized portions of
pancreas appear normal.

Liver measures 18.2 cm in length, enlarged.  There is diffuse
increased echogenicity in the liver consistent with fatty change.
No focal liver lesions are appreciated on this study.

Flow in the portal vein is in the anatomic direction.

Spleen is normal in size and homogeneous echotexture.  Kidneys
bilaterally appear normal.  There is no ascites.  Aorta is
nonaneurysmal.  Inferior vena cava is patent.
CONCLUSION: Liver is enlarged and shows evidence of diffuse fatty
change.  While no focal liver lesions are identified, it must be
cautioned that the sensitivity of ultrasound for more subtle liver
lesions is diminished given underlying fatty change.

Portions of the pancreas were obscured by gas; visualized portions
of pancreas appear normal.

Study otherwise unremarkable.

## 2013-06-05 ENCOUNTER — Other Ambulatory Visit: Payer: Self-pay

## 2013-06-05 DIAGNOSIS — R7989 Other specified abnormal findings of blood chemistry: Secondary | ICD-10-CM

## 2013-06-05 DIAGNOSIS — R945 Abnormal results of liver function studies: Secondary | ICD-10-CM

## 2013-06-26 LAB — HEPATIC FUNCTION PANEL
ALT: 71 U/L — AB (ref 0–53)
AST: 47 U/L — AB (ref 0–37)
Albumin: 4.8 g/dL (ref 3.5–5.2)
Alkaline Phosphatase: 92 U/L (ref 39–117)
BILIRUBIN DIRECT: 0.1 mg/dL (ref 0.0–0.3)
BILIRUBIN INDIRECT: 0.3 mg/dL (ref 0.2–1.2)
TOTAL PROTEIN: 7.5 g/dL (ref 6.0–8.3)
Total Bilirubin: 0.4 mg/dL (ref 0.2–1.2)

## 2013-07-02 ENCOUNTER — Other Ambulatory Visit: Payer: Self-pay

## 2013-07-02 DIAGNOSIS — R945 Abnormal results of liver function studies: Secondary | ICD-10-CM

## 2013-07-02 DIAGNOSIS — R7989 Other specified abnormal findings of blood chemistry: Secondary | ICD-10-CM

## 2013-07-29 ENCOUNTER — Other Ambulatory Visit: Payer: Self-pay

## 2013-07-29 DIAGNOSIS — R7989 Other specified abnormal findings of blood chemistry: Secondary | ICD-10-CM

## 2013-07-29 DIAGNOSIS — R945 Abnormal results of liver function studies: Secondary | ICD-10-CM

## 2013-08-13 ENCOUNTER — Ambulatory Visit (INDEPENDENT_AMBULATORY_CARE_PROVIDER_SITE_OTHER): Payer: Medicaid Other | Admitting: Family Medicine

## 2013-08-13 ENCOUNTER — Encounter: Payer: Self-pay | Admitting: Family Medicine

## 2013-08-13 VITALS — Temp 99.5°F | Ht 67.0 in | Wt 170.0 lb

## 2013-08-13 DIAGNOSIS — J209 Acute bronchitis, unspecified: Secondary | ICD-10-CM

## 2013-08-13 DIAGNOSIS — J019 Acute sinusitis, unspecified: Secondary | ICD-10-CM

## 2013-08-13 MED ORDER — CEFPROZIL 500 MG PO TABS: 500.0000 mg | ORAL_TABLET | Freq: Two times a day (BID) | ORAL | Status: DC

## 2013-08-13 NOTE — Progress Notes (Signed)
   Subjective:    Patient ID: Harold Huber, Harold Huber    DOB: 1990/05/20, 23 y.o.   MRN: [id number]  Cough This is a new problem. The current episode started yesterday. Associated symptoms include a fever and nasal congestion. He has tried OTC cough suppressant (tylenol, motrin) for the symptoms.   PMH benign developmental issues   Review of Systems  Constitutional: Positive for fever.  Respiratory: Positive for cough.        Objective:   Physical Exam  Lungs congested cough noted no crackles or wheezes heart regular needs membranes moist neck supple      Assessment & Plan:  Upper respiratory illness with bronchitis antibiotics prescribed warning signs discussed followup if ongoing troubles

## 2013-08-22 LAB — HEPATIC FUNCTION PANEL
ALBUMIN: 4.5 g/dL (ref 3.5–5.2)
ALT: 68 U/L — AB (ref 0–53)
AST: 41 U/L — AB (ref 0–37)
Alkaline Phosphatase: 78 U/L (ref 39–117)
BILIRUBIN DIRECT: 0.1 mg/dL (ref 0.0–0.3)
Indirect Bilirubin: 0.2 mg/dL (ref 0.2–1.2)
TOTAL PROTEIN: 7 g/dL (ref 6.0–8.3)
Total Bilirubin: 0.3 mg/dL (ref 0.2–1.2)

## 2013-08-27 ENCOUNTER — Encounter: Payer: Self-pay | Admitting: Gastroenterology

## 2013-08-27 ENCOUNTER — Ambulatory Visit (INDEPENDENT_AMBULATORY_CARE_PROVIDER_SITE_OTHER): Payer: Medicaid Other | Admitting: Gastroenterology

## 2013-08-27 VITALS — BP 141/84 | HR 98 | Temp 97.6°F | Ht 68.0 in | Wt 168.6 lb

## 2013-08-27 DIAGNOSIS — R945 Abnormal results of liver function studies: Secondary | ICD-10-CM

## 2013-08-27 DIAGNOSIS — R7989 Other specified abnormal findings of blood chemistry: Secondary | ICD-10-CM

## 2013-08-27 NOTE — Assessment & Plan Note (Signed)
Extensive work-up performed. Likely due to fatty liver. For definitive reasons, would need liver biopsy. Caregivers are hesitant to proceed with this, and I feel it is appropriate to continue appropriate diet and behavior modification, serial monitoring of LFTs, and proceed with liver biopsy if a significant change.   Soft stools several times a day but at baseline. Levsin prn.   Return in 6 months, labs in Sept 2015.

## 2013-08-27 NOTE — Patient Instructions (Signed)
Continue to take Levsin as needed.   We will check your liver numbers again in September 2015. I would like to see you back in 6 months.   For his hands: you may use Aquaphor cream or Eucerin twice a day liberally.
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
## 2013-08-27 NOTE — Progress Notes (Signed)
cc'd to pcp 

## 2013-12-08 ENCOUNTER — Encounter: Payer: Self-pay | Admitting: Family Medicine

## 2013-12-08 ENCOUNTER — Ambulatory Visit (INDEPENDENT_AMBULATORY_CARE_PROVIDER_SITE_OTHER): Payer: Medicaid Other | Admitting: Family Medicine

## 2013-12-08 VITALS — BP 128/84 | Ht 63.5 in | Wt 160.0 lb

## 2013-12-08 DIAGNOSIS — Z00129 Encounter for routine child health examination without abnormal findings: Secondary | ICD-10-CM

## 2013-12-08 DIAGNOSIS — R7989 Other specified abnormal findings of blood chemistry: Secondary | ICD-10-CM

## 2013-12-08 DIAGNOSIS — R945 Abnormal results of liver function studies: Secondary | ICD-10-CM

## 2013-12-08 DIAGNOSIS — R5383 Other fatigue: Secondary | ICD-10-CM

## 2013-12-08 DIAGNOSIS — R5381 Other malaise: Secondary | ICD-10-CM

## 2013-12-08 DIAGNOSIS — Z0000 Encounter for general adult medical examination without abnormal findings: Secondary | ICD-10-CM

## 2013-12-08 DIAGNOSIS — Z79899 Other long term (current) drug therapy: Secondary | ICD-10-CM
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
## 2014-01-03 LAB — BASIC METABOLIC PANEL
BUN: 10 mg/dL (ref 6–23)
CALCIUM: 9.9 mg/dL (ref 8.4–10.5)
CO2: 28 mEq/L (ref 19–32)
Chloride: 103 mEq/L (ref 96–112)
Creat: 0.76 mg/dL (ref 0.50–1.35)
Glucose, Bld: 74 mg/dL (ref 70–99)
POTASSIUM: 4.5 meq/L (ref 3.5–5.3)
Sodium: 141 mEq/L (ref 135–145)

## 2014-01-03 LAB — CBC WITH DIFFERENTIAL/PLATELET
BASOS ABS: 0.1 10*3/uL (ref 0.0–0.1)
BASOS PCT: 1 % (ref 0–1)
Eosinophils Absolute: 0.7 10*3/uL (ref 0.0–0.7)
Eosinophils Relative: 7 % — ABNORMAL HIGH (ref 0–5)
HCT: 48.3 % (ref 39.0–52.0)
Hemoglobin: 16.5 g/dL (ref 13.0–17.0)
Lymphocytes Relative: 44 % (ref 12–46)
Lymphs Abs: 4.3 10*3/uL — ABNORMAL HIGH (ref 0.7–4.0)
MCH: 30.2 pg (ref 26.0–34.0)
MCHC: 34.2 g/dL (ref 30.0–36.0)
MCV: 88.3 fL (ref 78.0–100.0)
Monocytes Absolute: 1 10*3/uL (ref 0.1–1.0)
Monocytes Relative: 10 % (ref 3–12)
NEUTROS ABS: 3.7 10*3/uL (ref 1.7–7.7)
NEUTROS PCT: 38 % — AB (ref 43–77)
Platelets: 279 10*3/uL (ref 150–400)
RBC: 5.47 MIL/uL (ref 4.22–5.81)
RDW: 14.3 % (ref 11.5–15.5)
WBC: 9.8 10*3/uL (ref 4.0–10.5)

## 2014-01-03 LAB — HEPATIC FUNCTION PANEL
ALBUMIN: 5.1 g/dL (ref 3.5–5.2)
ALT: 71 U/L — ABNORMAL HIGH (ref 0–53)
AST: 47 U/L — AB (ref 0–37)
Alkaline Phosphatase: 75 U/L (ref 39–117)
BILIRUBIN DIRECT: 0.1 mg/dL (ref 0.0–0.3)
BILIRUBIN TOTAL: 0.5 mg/dL (ref 0.2–1.2)
Indirect Bilirubin: 0.4 mg/dL (ref 0.2–1.2)
Total Protein: 7.7 g/dL (ref 6.0–8.3)

## 2014-01-03 LAB — CARBAMAZEPINE LEVEL, TOTAL: CARBAMAZEPINE LVL: 8.2 ug/mL (ref 4.0–12.0)

## 2014-01-27 ENCOUNTER — Encounter: Payer: Self-pay | Admitting: Internal Medicine

## 2014-02-16 ENCOUNTER — Other Ambulatory Visit: Payer: Self-pay | Admitting: Gastroenterology

## 2014-03-26 ENCOUNTER — Encounter: Payer: Self-pay | Admitting: Gastroenterology

## 2014-03-26 ENCOUNTER — Ambulatory Visit (INDEPENDENT_AMBULATORY_CARE_PROVIDER_SITE_OTHER): Payer: Medicaid Other | Admitting: Gastroenterology

## 2014-03-26 VITALS — BP 161/78 | HR 52 | Ht 67.0 in | Wt 173.2 lb

## 2014-03-26 DIAGNOSIS — R7989 Other specified abnormal findings of blood chemistry: Secondary | ICD-10-CM

## 2014-03-26 DIAGNOSIS — B9681 Helicobacter pylori [H. pylori] as the cause of diseases classified elsewhere: Secondary | ICD-10-CM

## 2014-03-26 DIAGNOSIS — R197 Diarrhea, unspecified: Secondary | ICD-10-CM

## 2014-03-26 DIAGNOSIS — R945 Abnormal results of liver function studies: Secondary | ICD-10-CM

## 2014-03-26 DIAGNOSIS — A048 Other specified bacterial intestinal infections: Secondary | ICD-10-CM | POA: Insufficient documentation

## 2014-03-26 MED ORDER — HYOSCYAMINE SULFATE 0.125 MG SL SUBL: SUBLINGUAL_TABLET | SUBLINGUAL | Status: DC

## 2014-03-26 NOTE — Assessment & Plan Note (Signed)
With extensive work-up performed. Likely secondary to fatty liver but liver biopsy would help for definitive reasons. Caregivers declining this, which is appropriate at this time. However, if any significant change in LFTs, would consider. Return in 6 months, labs at that time.

## 2014-03-26 NOTE — Assessment & Plan Note (Signed)
Treated with pylera in 2014. Check H.pylori stool antigen for eradication.
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
## 2014-03-26 NOTE — Assessment & Plan Note (Signed)
Doing well with Levsin. Continue prn. Return in 6 months.

## 2014-03-26 NOTE — Progress Notes (Signed)
cc'ed to pcp °

## 2014-03-26 NOTE — Patient Instructions (Signed)
Take Levsin as needed. After the 3rd loose stool, go ahead and take a dose.   Please complete the stool sample and return to the lab.   We will see you back in 6 months! We will recheck liver numbers at that time.   Merry Christmas!

## 2014-04-01 LAB — HELICOBACTER PYLORI  SPECIAL ANTIGEN: H. PYLORI Antigen: NEGATIVE

## 2014-04-07 NOTE — Progress Notes (Signed)
Quick Note:  Good news. H.pylori stool antigen negative. ______

## 2014-05-05 ENCOUNTER — Telehealth: Payer: Self-pay | Admitting: Family Medicine

## 2014-05-05 NOTE — Telephone Encounter (Signed)
Pts case worker is wanting to have a security system put in the home,  But they are needed a letter of necessity from the PCP to send in for the System to be paid for by Medicaid.   Please put on letter head and call family when ready for pick up.

## 2014-05-05 NOTE — Telephone Encounter (Signed)
Certainly I want to help if possible. Nurses, please call family. Diplomatically speak with the family why are they needing a letter of necessity for  a security system? Personally I am not certain if it will be covered by Medicaid? Certainly I am not the judge, I will be happy to write a letter if I have better information why. Whether or not a gets approved will be up to Medicaid not me.

## 2014-05-05 NOTE — Telephone Encounter (Signed)
Pt wakes multiple times during the night and they are worried about him going out of the house while they are sleeping.

## 2014-05-11 NOTE — Telephone Encounter (Signed)
A letter was completed. Please forward to the family.

## 2014-06-17 ENCOUNTER — Telehealth: Payer: Self-pay | Admitting: Family Medicine

## 2014-06-17 DIAGNOSIS — G809 Cerebral palsy, unspecified: Secondary | ICD-10-CM

## 2014-06-17 NOTE — Telephone Encounter (Signed)
pts grandmother calling to say that his case manager states that  The letter you wrote was not sufficient enough for the home security system, They state that [facility]  To do an in home evaluation for this. They suggest it be in home OT/PT that comes Out to do the eval...please advise

## 2014-06-17 NOTE — Telephone Encounter (Signed)
Please put in a referral for [facility] to go do a home assessment regarding security system preferably having occupational therapy also do a consultation. Also communicate to the family that this referral was put in.

## 2014-06-18 NOTE — Telephone Encounter (Signed)
Referral to [facility] placed in Epic.

## 2014-08-04 ENCOUNTER — Encounter: Payer: Self-pay | Admitting: Family Medicine

## 2014-08-04 ENCOUNTER — Ambulatory Visit (INDEPENDENT_AMBULATORY_CARE_PROVIDER_SITE_OTHER): Payer: Medicaid Other | Admitting: Family Medicine

## 2014-08-04 VITALS — BP 132/86 | Temp 98.4°F | Ht 63.5 in | Wt 166.0 lb

## 2014-08-04 DIAGNOSIS — J301 Allergic rhinitis due to pollen: Secondary | ICD-10-CM | POA: Diagnosis not present

## 2014-08-04 DIAGNOSIS — G809 Cerebral palsy, unspecified: Secondary | ICD-10-CM | POA: Diagnosis not present

## 2014-08-04 MED ORDER — LORATADINE 10 MG PO TABS: 10.0000 mg | ORAL_TABLET | Freq: Every day | ORAL | Status: DC

## 2014-08-04 NOTE — Progress Notes (Signed)
   Subjective:    Patient ID: Harold Huber, male    DOB: Feb 11, 1991, 24 y.o.   MRN: [id number]  HPIpt arrives today for evaluation for home security system. Sometimes pt goes out of the house with anyone knowing. Pt does not sleep well. Hard for caretakers to catch him when he gets out of house. Have had to call 911 to come out and get him up off the ground because once he goes out he doesn't want to come back in.   Concerns about nasal drainage. Clear. A little cough.     Review of Systems    he is nonviable. Has had some allergy issues lately clear nasal drainage using allergy medicines for this. Objective:   Physical Exam  On physical exam lungs clear hearts regular this young man does have cerebral palsy and has a difficult time interacting. He is somewhat unpredictable with his movements which certainly could cause him to easily go outside without alerting anyone which would be a danger to him      Assessment & Plan:  Cerebral palsy he is a danger to himself he could go outside without notifying anyone in get lost or get injured. I do believe that a security system on his house is necessary so that caretakers would be alerted immediately if he should go outside.  Patient was seen as part of a face-to-face evaluation that does indicate that this patient would benefit from physical therapy occupational therapy evaluation for the purpose of a home security system. According to his social service caseworker this is necessary in order [facility] get [facility].  Allergies OTC measures discussed.

## 2014-08-06 ENCOUNTER — Telehealth: Payer: Self-pay | Admitting: Family Medicine

## 2014-08-06 NOTE — Telephone Encounter (Signed)
[facility], please advise  1st note came in referral message, 2nd note was sent via email  Holley BoucheHey Brendale, I hope you're doing well. I'm not sure where the social worker got that from but Medicaid won't allow us to go out and do an assessment just for a home security system. I'm not sure where she got that from but we are only allowed to go out to the home for a medical need. I'm not sure who would do the assessments for the security system. I would just tell them to call Medicaid and ask how to get qualified for that (I wasn't aware that Medicaid would pay for a security system) Sorry for the confusion.     You may want to see if [employer]- [phone] or [employer] - [phone] (they do [place]) can assist in the security system.

## 2014-08-06 NOTE — Telephone Encounter (Signed)
Please update grandparents who are the caretakers of the current status. Please let them know how this appears to be a snafu regarding who needs to do what. Reassure the grandparents we are more than happy to help but we need better information from the social worker. Find out who their social worker is. Please connect with the social worker and relayed this information. Hopefully the social worker can connect with Medicaid and find out exactly who needs to be consult to to help him with this approved. According to [facility] they are not people who can do evaluations for this. Obviously we are stuck in the middle on this processing cannot proceed forward until we get better information from the social worker.

## 2014-08-10 ENCOUNTER — Telehealth: Payer: Self-pay | Admitting: Family Medicine

## 2014-08-10 NOTE — Telephone Encounter (Signed)
They ,family,need to speak with their social service worker, if need be have the soc service worker call here and specifically state to us what is needed, we are tyryinh to help

## 2014-08-10 NOTE — Telephone Encounter (Signed)
Pts states [facility] says they won't do the evaluation for the Home security system

## 2014-09-04 NOTE — Telephone Encounter (Signed)
TCLB °

## 2014-09-08 NOTE — Telephone Encounter (Signed)
Spoke with grandmother, she states that she's contacted case manager and explained that home health can not do the eval needed for qualification of the home security system.  She has not heard back from the case manager.  We discussed it and have concluded that until we get more detailed information on who to consult we can't press forward.   Grandmother agress and will call us if she hears anything or needs anything

## 2014-09-28 ENCOUNTER — Ambulatory Visit: Payer: Self-pay | Admitting: Gastroenterology

## 2014-09-30 ENCOUNTER — Encounter: Payer: Self-pay | Admitting: Nurse Practitioner

## 2014-09-30 ENCOUNTER — Ambulatory Visit (INDEPENDENT_AMBULATORY_CARE_PROVIDER_SITE_OTHER): Payer: Medicaid Other | Admitting: Nurse Practitioner

## 2014-09-30 VITALS — BP 125/75 | HR 72 | Temp 98.4°F | Ht 68.0 in | Wt 165.0 lb

## 2014-09-30 DIAGNOSIS — B9681 Helicobacter pylori [H. pylori] as the cause of diseases classified elsewhere: Secondary | ICD-10-CM

## 2014-09-30 DIAGNOSIS — R197 Diarrhea, unspecified: Secondary | ICD-10-CM

## 2014-09-30 DIAGNOSIS — R7989 Other specified abnormal findings of blood chemistry: Secondary | ICD-10-CM

## 2014-09-30 DIAGNOSIS — A048 Other specified bacterial intestinal infections: Secondary | ICD-10-CM

## 2014-09-30 DIAGNOSIS — R945 Abnormal results of liver function studies: Secondary | ICD-10-CM
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
## 2014-09-30 NOTE — Patient Instructions (Addendum)
1. Have your lab work drawn when you're able to. 2. Follow-up in 6 months for further evaluation. 3. Call us if CBG and have any worsening symptoms or recurrent symptoms. 4. Upon investigation his seizure medication, carbamazepine, does have a potential for increased liver enzymes. However, given the excellent control this provided his (previously) life-threatening seizures it is likely not a medicine that needs to be discontinued with the mild elevation in his liver enzymes. If things change, this can be re-evalated.

## 2014-09-30 NOTE — Assessment & Plan Note (Signed)
Patient has had any further symptoms since successful treatment of H. pylori. Eradication is documented at last visit in December 2015. Follow up on this problem as needed.

## 2014-09-30 NOTE — Assessment & Plan Note (Signed)
Diarrhea symptoms are much improved. Patient has a bowel movement on to 3 times daily which is typically soft, but not watery. At this point he is only requiring Levsin about once a week. Continue to monitor symptoms and return for follow-up in 6 months.

## 2014-09-30 NOTE — Assessment & Plan Note (Signed)
Patient with an increase in liver function tests about a year ago, which have subsequently trended down. At last check he had minor elevations in AST/ALT area this is attributed to likely fatty liver. It is worth noting that his antiseizure medication does have a potential side effect for increased liver function tests and he has been on this for quite a while. However, prior to starting this medication he was having life-threatening seizures and since then he has not had a seizure in over 10-15 years. At this point we will check his liver function and follow-up in 6 months.

## 2014-09-30 NOTE — Progress Notes (Signed)
cc'd to pcp 

## 2014-10-07 LAB — CBC WITH DIFFERENTIAL/PLATELET
BASOS PCT: 0 % (ref 0–1)
Basophils Absolute: 0 10*3/uL (ref 0.0–0.1)
EOS ABS: 0.4 10*3/uL (ref 0.0–0.7)
Eosinophils Relative: 5 % (ref 0–5)
HCT: 47.9 % (ref 39.0–52.0)
HEMOGLOBIN: 16.6 g/dL (ref 13.0–17.0)
Lymphocytes Relative: 44 % (ref 12–46)
Lymphs Abs: 3.9 10*3/uL (ref 0.7–4.0)
MCH: 30.7 pg (ref 26.0–34.0)
MCHC: 34.7 g/dL (ref 30.0–36.0)
MCV: 88.7 fL (ref 78.0–100.0)
MPV: 9.1 fL (ref 8.6–12.4)
Monocytes Absolute: 0.8 10*3/uL (ref 0.1–1.0)
Monocytes Relative: 9 % (ref 3–12)
NEUTROS PCT: 42 % — AB (ref 43–77)
Neutro Abs: 3.7 10*3/uL (ref 1.7–7.7)
PLATELETS: 275 10*3/uL (ref 150–400)
RBC: 5.4 MIL/uL (ref 4.22–5.81)
RDW: 13.7 % (ref 11.5–15.5)
WBC: 8.9 10*3/uL (ref 4.0–10.5)

## 2014-10-08 LAB — HEPATIC FUNCTION PANEL
ALK PHOS: 79 U/L (ref 39–117)
ALT: 46 U/L (ref 0–53)
AST: 29 U/L (ref 0–37)
Albumin: 4.8 g/dL (ref 3.5–5.2)
BILIRUBIN INDIRECT: 0.2 mg/dL (ref 0.2–1.2)
BILIRUBIN TOTAL: 0.3 mg/dL (ref 0.2–1.2)
Bilirubin, Direct: 0.1 mg/dL (ref 0.0–0.3)
Total Protein: 7.6 g/dL (ref 6.0–8.3)

## 2014-10-08 LAB — BASIC METABOLIC PANEL
BUN: 10 mg/dL (ref 6–23)
CALCIUM: 9.9 mg/dL (ref 8.4–10.5)
CO2: 24 meq/L (ref 19–32)
CREATININE: 0.72 mg/dL (ref 0.50–1.35)
Chloride: 105 mEq/L (ref 96–112)
Glucose, Bld: 86 mg/dL (ref 70–99)
Potassium: 3.8 mEq/L (ref 3.5–5.3)
Sodium: 143 mEq/L (ref 135–145)

## 2014-12-15 ENCOUNTER — Ambulatory Visit (INDEPENDENT_AMBULATORY_CARE_PROVIDER_SITE_OTHER): Payer: Medicaid Other | Admitting: Family Medicine

## 2014-12-15 ENCOUNTER — Encounter: Payer: Self-pay | Admitting: Family Medicine

## 2014-12-15 VITALS — Temp 97.8°F | Ht 63.5 in | Wt 158.1 lb

## 2014-12-15 DIAGNOSIS — L259 Unspecified contact dermatitis, unspecified cause: Secondary | ICD-10-CM

## 2014-12-15 DIAGNOSIS — G809 Cerebral palsy, unspecified: Secondary | ICD-10-CM | POA: Diagnosis not present

## 2014-12-15 MED ORDER — TRIAMCINOLONE ACETONIDE 0.1 % EX CREA: 1.0000 "application " | TOPICAL_CREAM | Freq: Three times a day (TID) | CUTANEOUS | Status: DC | PRN

## 2014-12-15 MED ORDER — PREDNISONE 20 MG PO TABS: ORAL_TABLET | ORAL | Status: DC

## 2014-12-15 NOTE — Progress Notes (Signed)
   Subjective:    Patient ID: Harold Huber, male    DOB: 27-Aug-1990, 24 y.o.   MRN: [id number]  Rash This is a new problem. The current episode started in the past 7 days. The problem has been gradually worsening since onset. The affected locations include the chest, back, left arm and right arm. The rash is characterized by redness (Raised). He was exposed to nothing. Pertinent negatives include no congestion, cough or fatigue. Treatments tried: Cortisone, Benadryl. The treatment provided no relief.   Patient states no other concerns this visit.   Review of Systems  Constitutional: Negative for activity change, appetite change and fatigue.  HENT: Negative for congestion.   Respiratory: Negative for cough.   Cardiovascular: Negative for chest pain.  Gastrointestinal: Negative for abdominal pain.  Endocrine: Negative for polydipsia and polyphagia.  Skin: Positive for rash.  Neurological: Negative for weakness.  Psychiatric/Behavioral: Negative for confusion.   Patient is stable with his cerebral palsy taking his medication is tolerating things well eating well    Objective:   Physical Exam  Constitutional: He appears well-nourished. No distress.  Cardiovascular: Normal rate, regular rhythm and normal heart sounds.   No murmur heard. Pulmonary/Chest: Effort normal and breath sounds normal. No respiratory distress.  Musculoskeletal: He exhibits no edema.  Lymphadenopathy:    He has no cervical adenopathy.  Neurological: He is alert.  Skin: Rash noted.  Psychiatric: His behavior is normal.  Vitals reviewed.  patient has contact dermatitis on right chest right bicep region let bicep region        Assessment & Plan:  Contact dermatitis steroid cream as directed short course of prednisone  Cerebral palsy stable continue current medications follow-up within 6 months

## 2014-12-29 ENCOUNTER — Ambulatory Visit: Payer: Medicaid Other | Admitting: Family Medicine

## 2015-03-01 ENCOUNTER — Encounter: Payer: Self-pay | Admitting: Internal Medicine

## 2015-04-19 ENCOUNTER — Telehealth: Payer: Self-pay | Admitting: Family Medicine

## 2015-04-19 ENCOUNTER — Encounter: Payer: Self-pay | Admitting: Family Medicine

## 2015-04-19 DIAGNOSIS — G809 Cerebral palsy, unspecified: Secondary | ICD-10-CM | POA: Insufficient documentation

## 2015-04-19 DIAGNOSIS — G40909 Epilepsy, unspecified, not intractable, without status epilepticus: Secondary | ICD-10-CM | POA: Insufficient documentation

## 2015-04-19 NOTE — Telephone Encounter (Signed)
Recently I received from [employer] [facility] a individual support plan for OGE Energyicholas. I have reviewed this plan I agree with it. I do not need a copy of this in the chart. It was reviewed and shredded.

## 2015-05-06 ENCOUNTER — Ambulatory Visit (INDEPENDENT_AMBULATORY_CARE_PROVIDER_SITE_OTHER): Payer: Medicaid Other | Admitting: Gastroenterology

## 2015-05-06 ENCOUNTER — Encounter: Payer: Self-pay | Admitting: Gastroenterology

## 2015-05-06 VITALS — BP 118/88 | HR 80 | Temp 97.0°F | Ht 67.0 in | Wt 161.6 lb

## 2015-05-06 DIAGNOSIS — K760 Fatty (change of) liver, not elsewhere classified: Secondary | ICD-10-CM | POA: Diagnosis not present

## 2015-05-06 LAB — HEPATIC FUNCTION PANEL
ALBUMIN: 5.4 g/dL — AB (ref 3.6–5.1)
ALT: 46 U/L (ref 9–46)
AST: 32 U/L (ref 10–40)
Alkaline Phosphatase: 81 U/L (ref 40–115)
Bilirubin, Direct: 0.1 mg/dL (ref <=0.2)
Indirect Bilirubin: 0.2 mg/dL (ref 0.2–1.2)
TOTAL PROTEIN: 8.2 g/dL — AB (ref 6.1–8.1)
Total Bilirubin: 0.3 mg/dL (ref 0.2–1.2)

## 2015-05-06 MED ORDER — HYOSCYAMINE SULFATE 0.125 MG SL SUBL: SUBLINGUAL_TABLET | SUBLINGUAL | Status: DC

## 2015-05-06 NOTE — Assessment & Plan Note (Signed)
With last LFTs normal 6 months ago. Recheck now. Doing quite well from a GI standpoint, only taking Levsin occasionally for episodic loose stool that appears to be rare and lasting less than 24 hours. Chronic issue. As elastography is now available, will obtain for baseline, check HFP, and have him return in 1 year unless clinically indicated.

## 2015-05-06 NOTE — Patient Instructions (Signed)
I have refilled the Levsin. Please let me know if he has diarrhea that does not go away or any other changes.  We will check blood work and a special ultrasound of his liver now.  We will see you back in 1 year or sooner if needed!

## 2015-05-06 NOTE — Progress Notes (Signed)
CC'ED TO PCP 
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
## 2015-05-10 ENCOUNTER — Other Ambulatory Visit: Payer: Self-pay

## 2015-05-10 DIAGNOSIS — K760 Fatty (change of) liver, not elsewhere classified: Secondary | ICD-10-CM

## 2015-05-11 NOTE — Progress Notes (Signed)
Quick Note:  LFTs look great! Elastography ordered. We will recheck LFTs in 6 months, routine.  I am copying Dr. Gerda Diss on this. Total protein and albumin very marginally elevated. FYI for any further serial monitoring, testing. Thanks! ______

## 2015-05-12 ENCOUNTER — Other Ambulatory Visit: Payer: Self-pay | Admitting: Gastroenterology

## 2015-05-12 DIAGNOSIS — R945 Abnormal results of liver function studies: Secondary | ICD-10-CM

## 2015-05-12 DIAGNOSIS — K760 Fatty (change of) liver, not elsewhere classified: Secondary | ICD-10-CM

## 2015-05-12 DIAGNOSIS — R7989 Other specified abnormal findings of blood chemistry: Secondary | ICD-10-CM

## 2015-05-14 ENCOUNTER — Ambulatory Visit (HOSPITAL_COMMUNITY): Admission: RE | Admit: 2015-05-14 | Payer: Medicaid Other | Source: Ambulatory Visit

## 2015-05-19 ENCOUNTER — Ambulatory Visit (HOSPITAL_COMMUNITY): Admission: RE | Admit: 2015-05-19 | Payer: Medicaid Other | Source: Ambulatory Visit

## 2015-06-04 ENCOUNTER — Ambulatory Visit (HOSPITAL_COMMUNITY)
Admission: RE | Admit: 2015-06-04 | Discharge: 2015-06-04 | Disposition: A | Discharge status: Home / Self Care | Payer: Medicaid Other | Source: Ambulatory Visit | Attending: Gastroenterology | Admitting: Gastroenterology

## 2015-06-04 ENCOUNTER — Other Ambulatory Visit (HOSPITAL_COMMUNITY): Payer: Self-pay

## 2015-06-04 DIAGNOSIS — K760 Fatty (change of) liver, not elsewhere classified: Secondary | ICD-10-CM | POA: Insufficient documentation

## 2015-06-15 NOTE — Progress Notes (Signed)
Quick Note:  Fatty liver but good Metavir score at F0/F1. [place] yearly. ______

## 2015-09-20 ENCOUNTER — Other Ambulatory Visit: Payer: Self-pay

## 2015-09-20 DIAGNOSIS — K760 Fatty (change of) liver, not elsewhere classified: Secondary | ICD-10-CM

## 2015-09-20 DIAGNOSIS — R945 Abnormal results of liver function studies: Secondary | ICD-10-CM

## 2015-09-20 DIAGNOSIS — R7989 Other specified abnormal findings of blood chemistry: Secondary | ICD-10-CM

## 2015-11-16 LAB — HEPATIC FUNCTION PANEL
ALBUMIN: 5.3 g/dL — AB (ref 3.6–5.1)
ALT: 40 U/L (ref 9–46)
AST: 26 U/L (ref 10–40)
Alkaline Phosphatase: 75 U/L (ref 40–115)
BILIRUBIN DIRECT: 0.1 mg/dL (ref <=0.2)
Indirect Bilirubin: 0.4 mg/dL (ref 0.2–1.2)
TOTAL PROTEIN: 7.6 g/dL (ref 6.1–8.1)
Total Bilirubin: 0.5 mg/dL (ref 0.2–1.2)

## 2015-11-18 NOTE — Progress Notes (Signed)
LFTs normal. Repeat in 1 year.

## 2015-11-22 ENCOUNTER — Other Ambulatory Visit: Payer: Self-pay | Admitting: Gastroenterology

## 2015-11-22 DIAGNOSIS — R7989 Other specified abnormal findings of blood chemistry: Secondary | ICD-10-CM

## 2015-11-22 DIAGNOSIS — R945 Abnormal results of liver function studies: Secondary | ICD-10-CM

## 2016-01-24 ENCOUNTER — Ambulatory Visit (INDEPENDENT_AMBULATORY_CARE_PROVIDER_SITE_OTHER): Payer: Medicaid Other | Admitting: Family Medicine

## 2016-01-24 VITALS — BP 128/74 | Ht 67.0 in | Wt 155.0 lb

## 2016-01-24 DIAGNOSIS — Z0000 Encounter for general adult medical examination without abnormal findings: Secondary | ICD-10-CM

## 2016-01-24 MED ORDER — LORATADINE 10 MG PO TABS: 10.0000 mg | ORAL_TABLET | Freq: Every day | ORAL | 12 refills | Status: AC
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
## 2016-03-11 IMAGING — US US ABDOMEN COMPLETE W/ ELASTOGRAPHY
2 series · 13 of 25 positions shown · non-contrast
Comparison: Abdominal ultrasound 06/25/2012.

CLINICAL DATA: Fatty liver.



[Series 1: us abdomen complete w/ elastography · 0.24mm/px · 11 of 75 slices shown (1 of 2)]
[im 1/75]
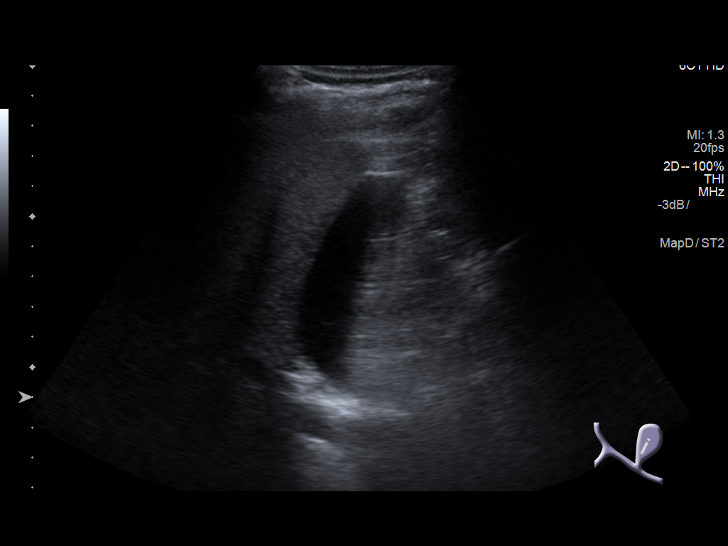
[im 8/75]
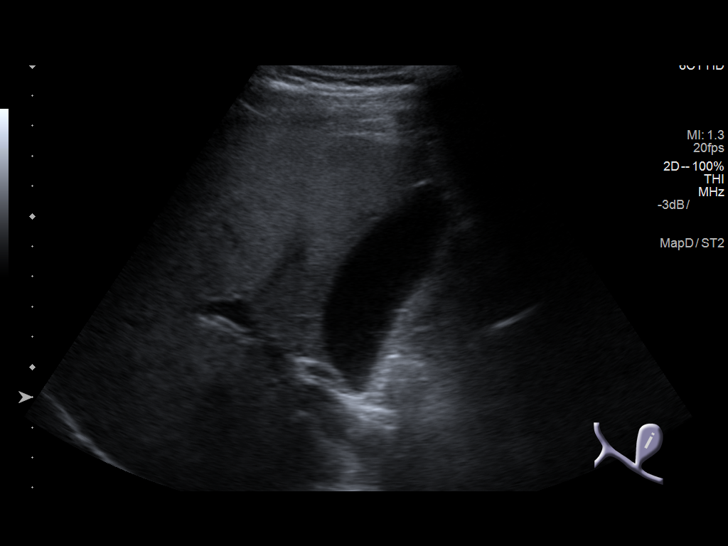
[im 15/75]
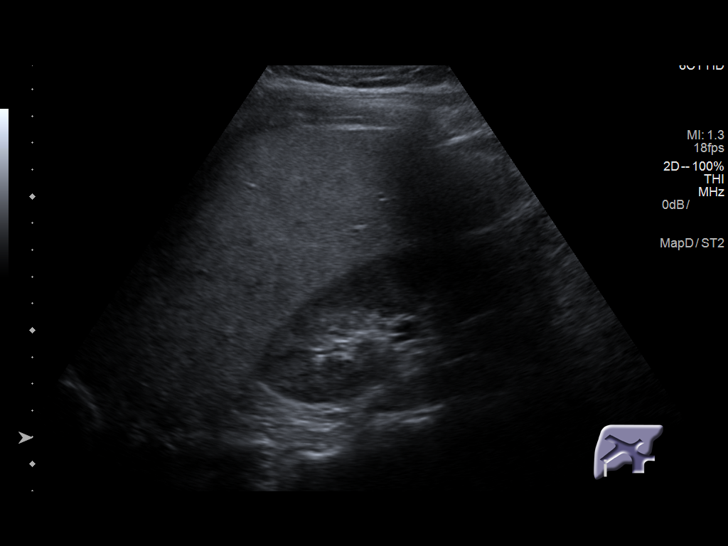
[im 23/75]
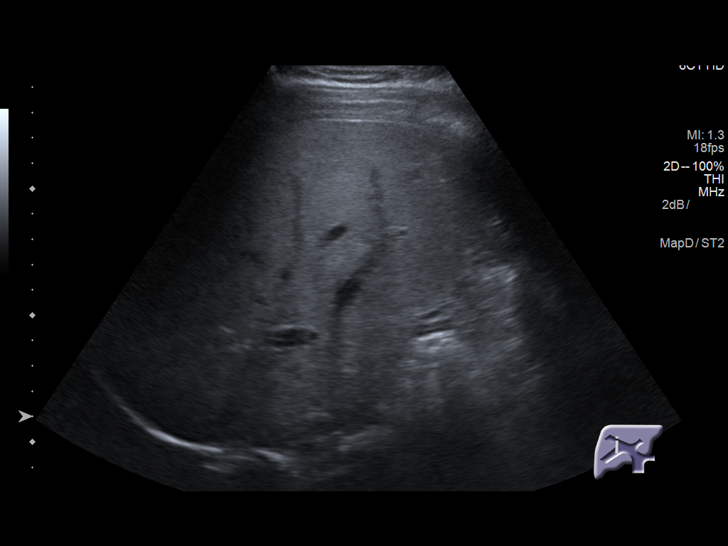
[im 30/75]
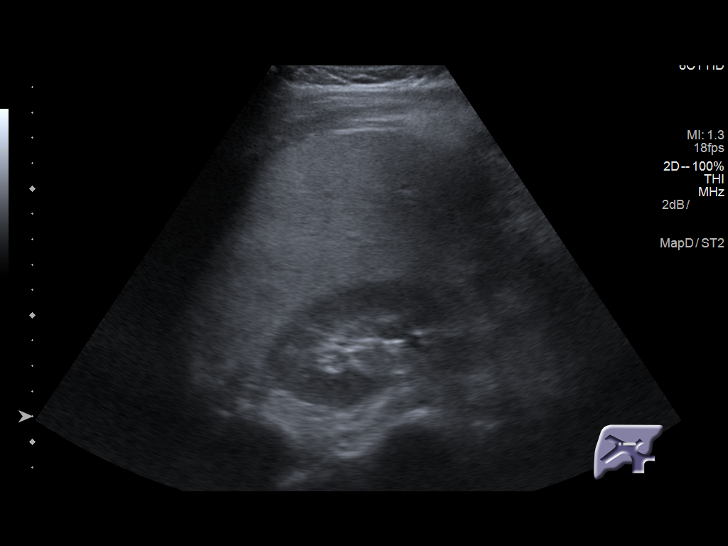
[im 38/75]
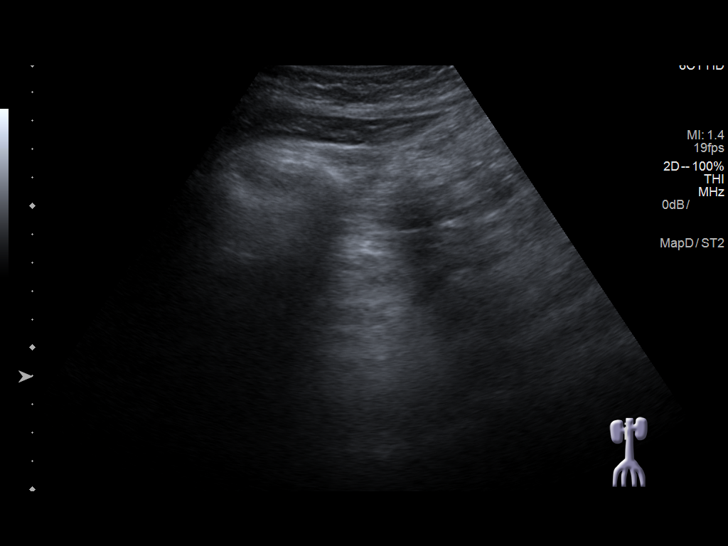
[im 45/75]
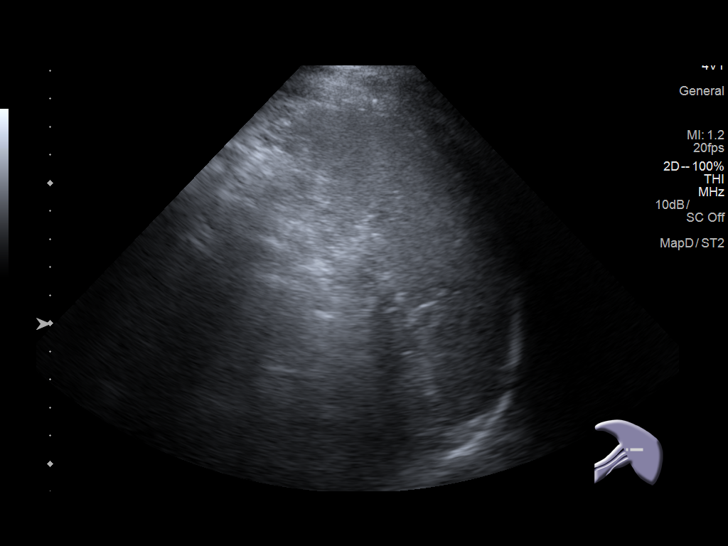
[im 52/75]
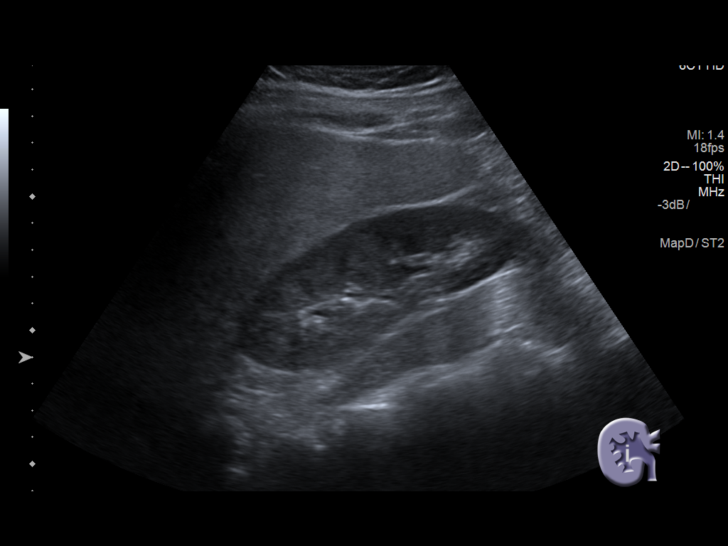
[im 60/75]
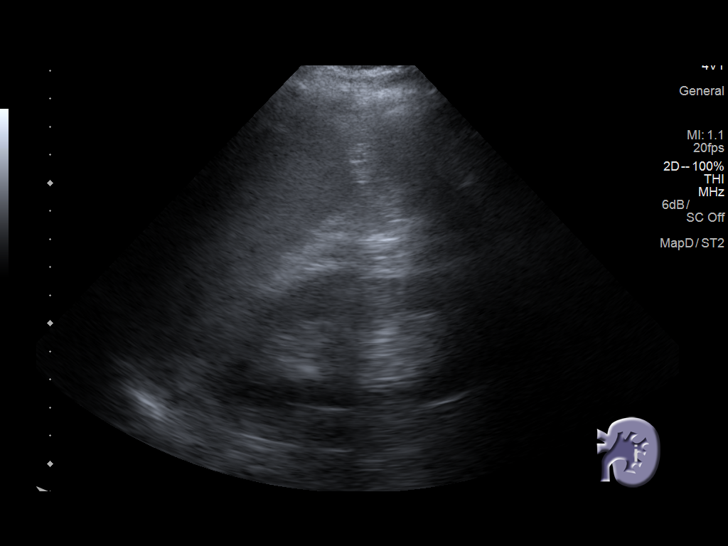
[im 67/75]
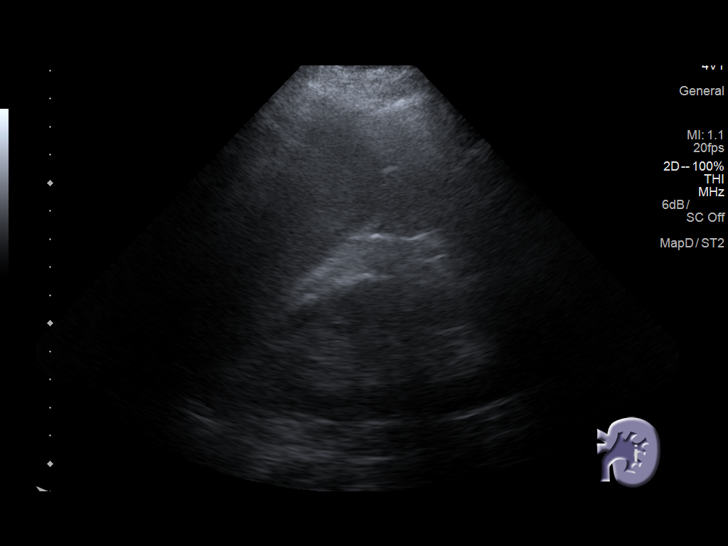
[im 75/75]
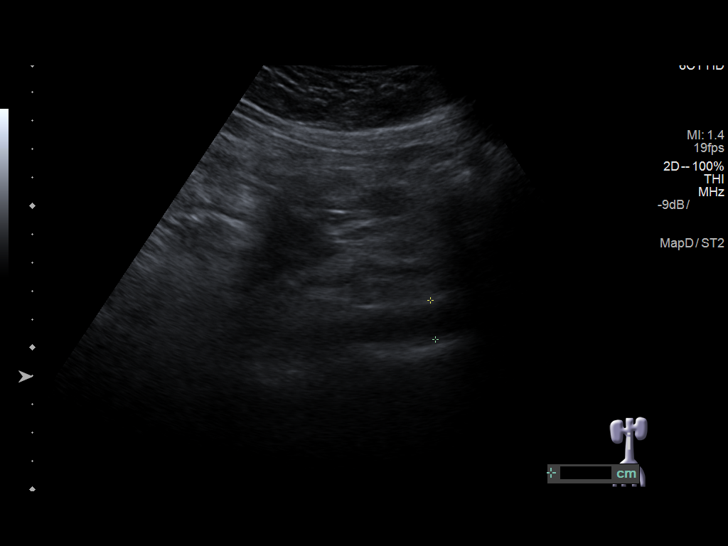

[Series 2001: us abdomen complete w/ elastography · 0.15mm/px · 2 of 13 slices shown (2 of 2)]
[im 5/13]
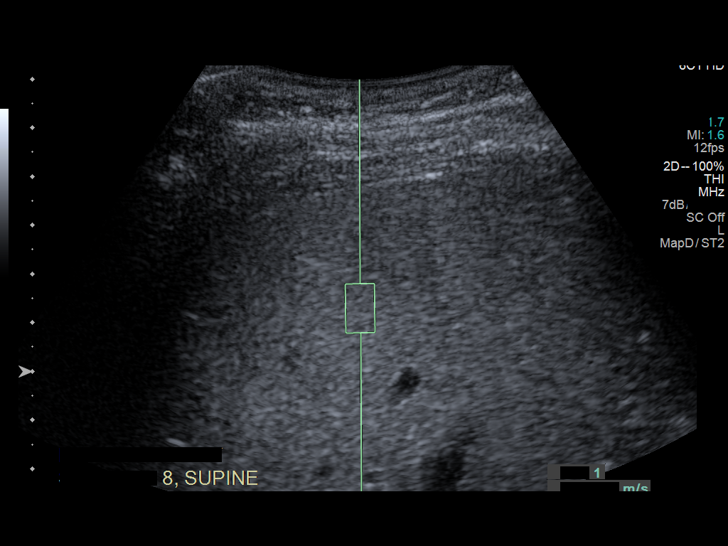
[im 13/13]
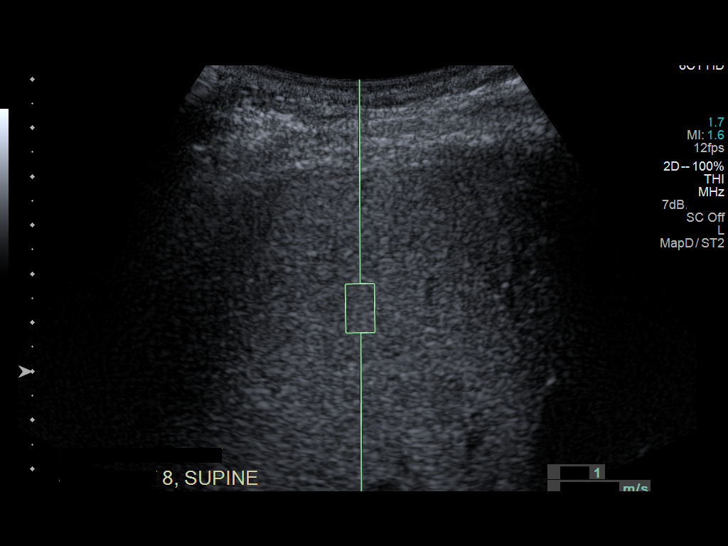

[13 of 25 positions shown; findings below may reference images not displayed]

FINDINGS: ULTRASOUND ABDOMEN

Gallbladder: No gallstones or wall thickening visualized. No
sonographic Murphy sign noted by sonographer.

Common bile duct: Diameter: 1.8 mm

Liver: The hepatic echogenicity is diffusely increased. No focal
lesions or definite contour irregularity identified.

IVC: No abnormality visualized.  Partially obscured by bowel gas.

Pancreas: Obscured by bowel gas and not visualized.

Spleen: Size and appearance within normal limits.

Right Kidney: Length: 12.6 cm. Echogenicity within normal limits. No
mass or hydronephrosis visualized.

Left Kidney: Length: 12.2 cm. Echogenicity within normal limits. No
mass or hydronephrosis visualized.

Abdominal aorta: No aneurysm visualized.

Other findings: None.

ULTRASOUND HEPATIC ELASTOGRAPHY

Device: Siemens Helix VTQ

Transducer 6C1

Patient position: Supine

Number of measurements:  10

Hepatic Segment:  8

Median velocity:   1.17  m/sec

IQR:

IQR/Median velocity ratio

Corresponding Metavir fibrosis score:  F0/F1

Risk of fibrosis: Minimal

Limitations of exam: Examination is mildly limited by patient motion
and combativeness.

Pertinent findings noted on other imaging exams:  None

Please note that abnormal shear wave velocities may also be
identified in clinical settings other than with hepatic fibrosis,
such as: acute hepatitis, elevated right heart and central venous
pressures including use of beta blockers, Mazinyani disease
(Jim), infiltrative processes such as
mastocytosis/amyloidosis/infiltrative tumor, extrahepatic
cholestasis, in the post-prandial state, and liver transplantation.
Correlation with patient history, laboratory data, and clinical
condition recommended.
IMPRESSION: 1. Diffusely increased hepatic echogenicity consistent with hepatic
steatosis. No focal abnormalities identified.
2. Otherwise unremarkable abdominal ultrasound with limitations as
above.
3. Median hepatic shear wave velocity is calculated at 1.17 m/sec.
Corresponding Metavir fibrosis score is F0 / F1. Risk of fibrosis is
minimal. Follow-up: None required.

## 2016-03-28 ENCOUNTER — Encounter: Payer: Self-pay | Admitting: Internal Medicine

## 2016-04-19 ENCOUNTER — Telehealth: Payer: Self-pay | Admitting: Internal Medicine

## 2016-04-19 NOTE — Telephone Encounter (Signed)
Feb recall for ultrasound  °

## 2016-04-19 NOTE — Telephone Encounter (Signed)
Letter mailed

## 2016-05-02 ENCOUNTER — Other Ambulatory Visit: Payer: Self-pay

## 2016-05-02 ENCOUNTER — Telehealth: Payer: Self-pay

## 2016-05-02 DIAGNOSIS — K760 Fatty (change of) liver, not elsewhere classified: Secondary | ICD-10-CM

## 2016-05-02 NOTE — Telephone Encounter (Signed)
Pt's grandmother called to schedule US abd w/elastography. US set-up for 05/25/16 at 9:30am. NPO after midnight. Called and informed his grandmother. Letter mailed.

## 2016-05-08 NOTE — Telephone Encounter (Signed)
PA info submitted online via [facility] website for Ultrasound Abdomen Complete with Elastography. Case is pending. Faxed clinical info to [employer].

## 2016-05-10 NOTE — Telephone Encounter (Signed)
Case still pending on [employer] website. Refaxed clinical info.

## 2016-05-11 NOTE — Telephone Encounter (Signed)
Case for Ultrasound of Abdomen was denied.  Tobi Bastosnna, can you please call [phone] to discuss case? Order number: [id number]. Pt is due for yearly ultrasound.

## 2016-05-12 NOTE — Telephone Encounter (Signed)
So, basically he was denied because he needs an office visit? I need to see the info before calling. If it says the reason for denial is needing an office visit, will need to bring him in.

## 2016-05-12 NOTE — Telephone Encounter (Signed)
Called [facility] to find out why case for Ultrasound was denied. Case was denied due to pt needed to have a clinical eval within past 60 days. Provider can do a peer to peer review.  Tobi Bastosnna, please call [phone], option 4, then option 2.

## 2016-05-15 NOTE — Telephone Encounter (Signed)
Called central scheduling and cancelled Ultrasound scheduled for 05/25/16.

## 2016-05-15 NOTE — Telephone Encounter (Signed)
Talked to [place]. Pt has not had OV in one year. Tobi Bastosnna advised for pt to have OV. Called pt's grandmother and informed. OV scheduled for 05/18/16 at 9:00am.

## 2016-05-18 ENCOUNTER — Telehealth: Payer: Self-pay

## 2016-05-18 ENCOUNTER — Encounter: Payer: Self-pay | Admitting: Gastroenterology

## 2016-05-18 ENCOUNTER — Ambulatory Visit (INDEPENDENT_AMBULATORY_CARE_PROVIDER_SITE_OTHER): Payer: Medicaid Other | Admitting: Gastroenterology

## 2016-05-18 VITALS — BP 131/78 | HR 93 | Temp 98.2°F | Ht 67.0 in | Wt 157.0 lb

## 2016-05-18 DIAGNOSIS — K760 Fatty (change of) liver, not elsewhere classified: Secondary | ICD-10-CM | POA: Diagnosis not present

## 2016-05-18 MED ORDER — HYOSCYAMINE SULFATE 0.125 MG SL SUBL: SUBLINGUAL_TABLET | SUBLINGUAL | 11 refills | Status: AC

## 2016-05-18 MED ORDER — PANTOPRAZOLE SODIUM 40 MG PO TBEC: 40.0000 mg | DELAYED_RELEASE_TABLET | Freq: Every day | ORAL | 3 refills | Status: AC
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
## 2016-05-18 NOTE — Progress Notes (Signed)
cc'ed to pcp °

## 2016-05-18 NOTE — Patient Instructions (Signed)
We are ordering the routine ultrasound for your liver. If this is good, we will just order as needed.  Please have blood work done next week at time of ultrasound. If everything still looks good, then we will just do yearly.  We will see you in February 2019!

## 2016-05-18 NOTE — Telephone Encounter (Signed)
[facility] scheduled for 05/24/16. Case is pending.

## 2016-05-18 NOTE — Assessment & Plan Note (Signed)
26 year old pleasant male with fatty liver. Normal LFTs a month ago. Metavir score F0/F1. Will not need yearly elastography; recommend one final ultrasound of liver now and then recheck prn if any bump in LFTs. Otherwise, yearly LFTs and will check now. Doing well with Levsin prn. Return in 1 year.

## 2016-05-22 NOTE — Telephone Encounter (Signed)
US abd limited approved. PA# [id number].

## 2016-05-24 ENCOUNTER — Ambulatory Visit (HOSPITAL_COMMUNITY): Payer: Medicaid Other

## 2016-05-25 ENCOUNTER — Ambulatory Visit (HOSPITAL_COMMUNITY): Payer: Medicaid Other

## 2016-06-07 ENCOUNTER — Ambulatory Visit (HOSPITAL_COMMUNITY)
Admission: RE | Admit: 2016-06-07 | Discharge: 2016-06-07 | Disposition: A | Discharge status: Home / Self Care | Payer: Medicaid Other | Source: Ambulatory Visit | Attending: Gastroenterology | Admitting: Gastroenterology

## 2016-06-07 DIAGNOSIS — K760 Fatty (change of) liver, not elsewhere classified: Secondary | ICD-10-CM | POA: Diagnosis not present

## 2016-06-08 LAB — HEPATIC FUNCTION PANEL
ALBUMIN: 5.1 g/dL (ref 3.6–5.1)
ALT: 41 U/L (ref 9–46)
AST: 28 U/L (ref 10–40)
Alkaline Phosphatase: 65 U/L (ref 40–115)
Bilirubin, Direct: 0.1 mg/dL (ref <=0.2)
Indirect Bilirubin: 0.4 mg/dL (ref 0.2–1.2)
Total Bilirubin: 0.5 mg/dL (ref 0.2–1.2)
Total Protein: 7.7 g/dL (ref 6.1–8.1)

## 2016-06-13 NOTE — Progress Notes (Signed)
US fatty liver. LFTs good. Repeat LFTs in 1 year, and we will be seeing him in 1 year.

## 2016-06-14 ENCOUNTER — Other Ambulatory Visit: Payer: Self-pay | Admitting: Gastroenterology

## 2016-06-14 DIAGNOSIS — R945 Abnormal results of liver function studies: Secondary | ICD-10-CM

## 2016-06-14 DIAGNOSIS — R7989 Other specified abnormal findings of blood chemistry: Secondary | ICD-10-CM

## 2016-06-14 NOTE — Progress Notes (Signed)
ON RECALL  °

## 2016-06-27 ENCOUNTER — Telehealth: Payer: Self-pay | Admitting: Family Medicine

## 2016-06-27 NOTE — Telephone Encounter (Signed)
Please do and I will sign it thank you

## 2016-06-27 NOTE — Telephone Encounter (Signed)
Patient needs Rx for shower chair to [facility].

## 2016-06-28 NOTE — Telephone Encounter (Signed)
Prescription fax[facility] to [facility]

## 2017-03-15 IMAGING — US US ABDOMEN LIMITED
1 series · 14 of 25 positions shown · non-contrast
Comparison: 06/04/2015.

CLINICAL DATA: Fatty liver disease.

EXAM:
US ABDOMEN LIMITED - RIGHT UPPER QUADRANT

[Series 1: us abdomen limited · 0.18mm/px · 14 of 46 slices shown]
[im 1/46]
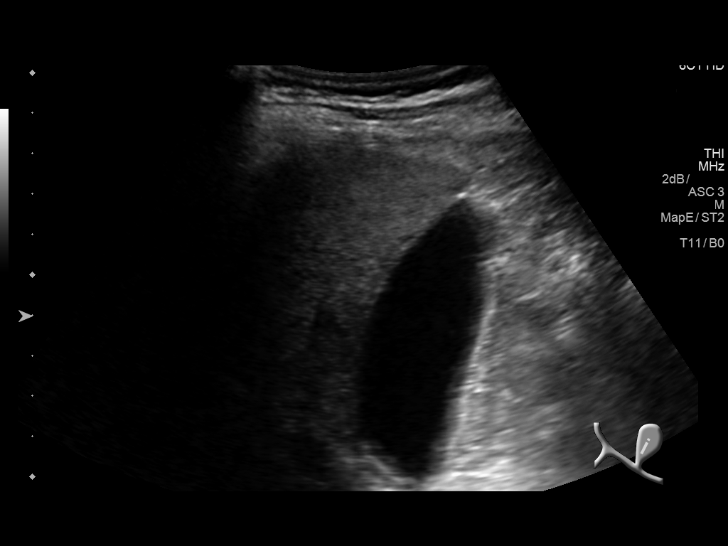
[im 4/46]
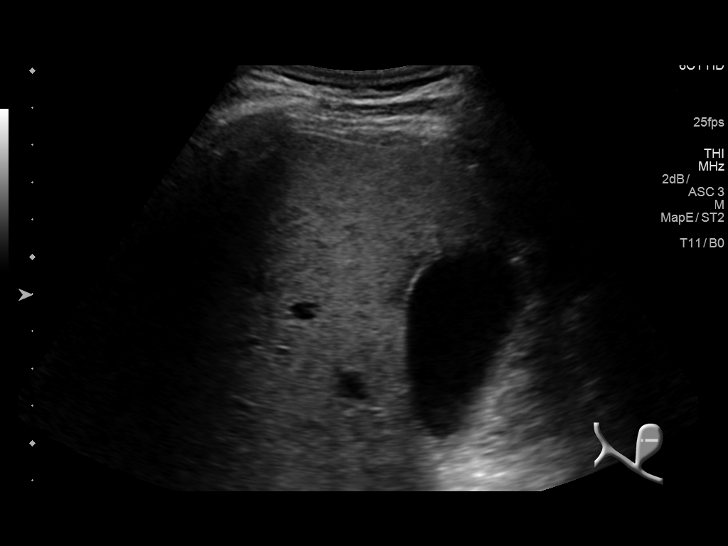
[im 8/46]
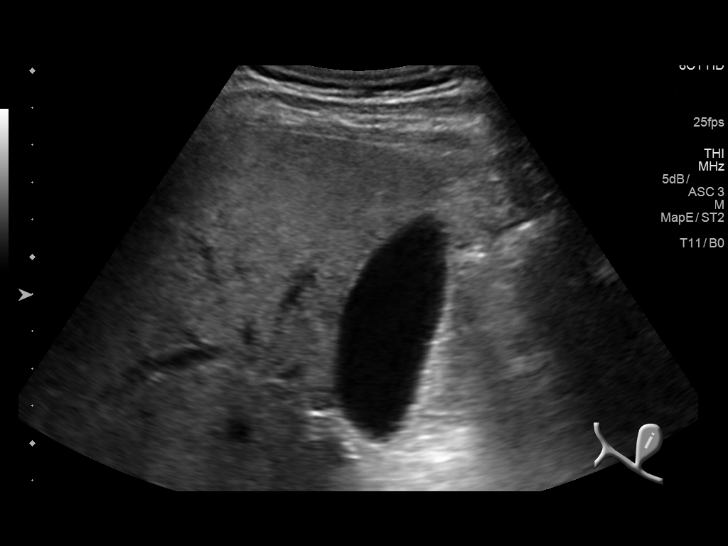
[im 12/46]
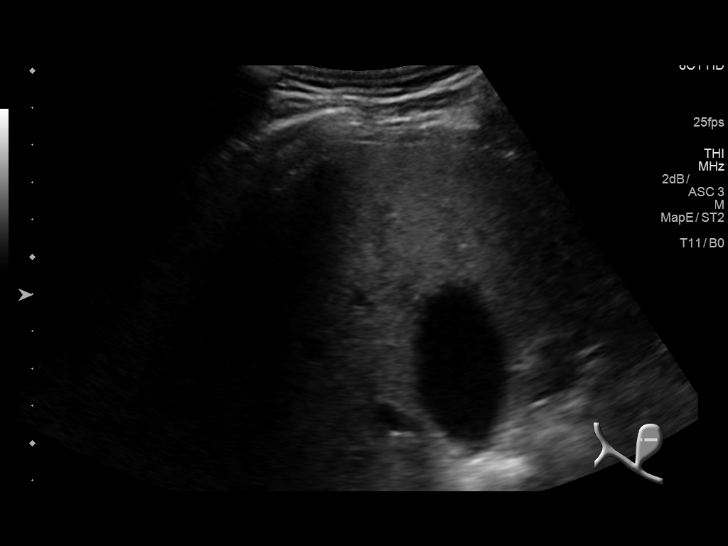
[im 16/46]
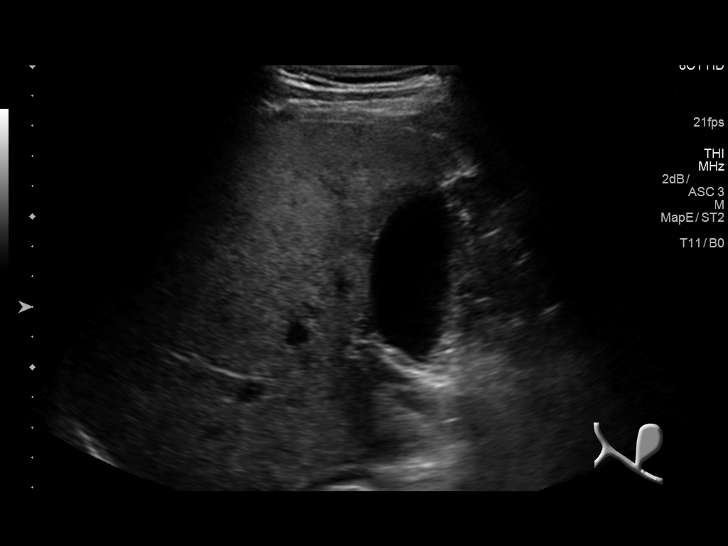
[im 17/46]
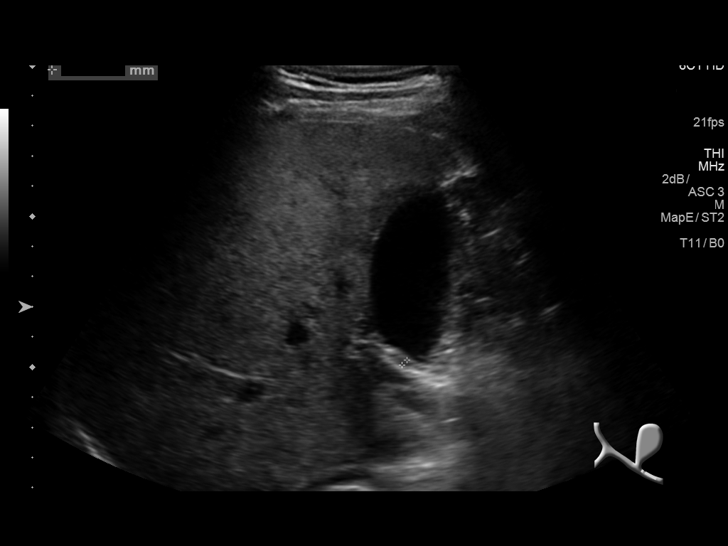
[im 21/46]
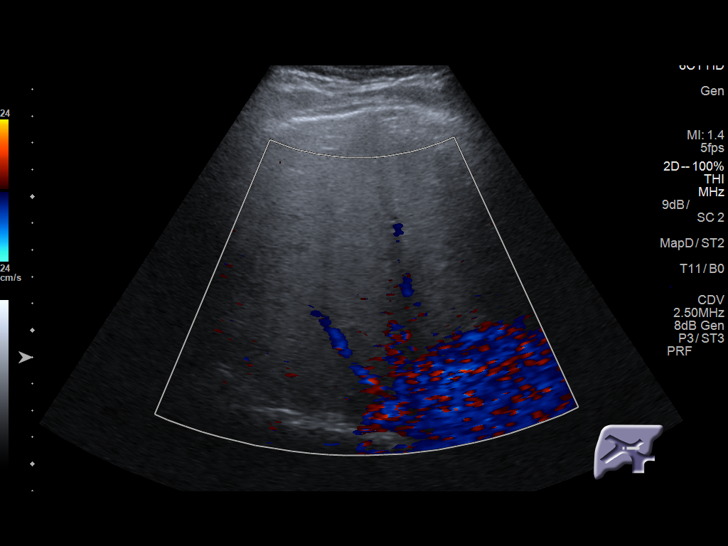
[im 25/46]
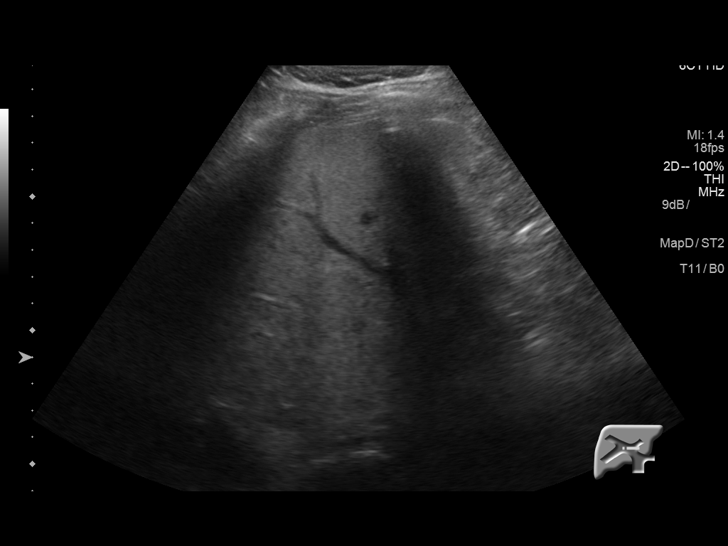
[im 29/46]
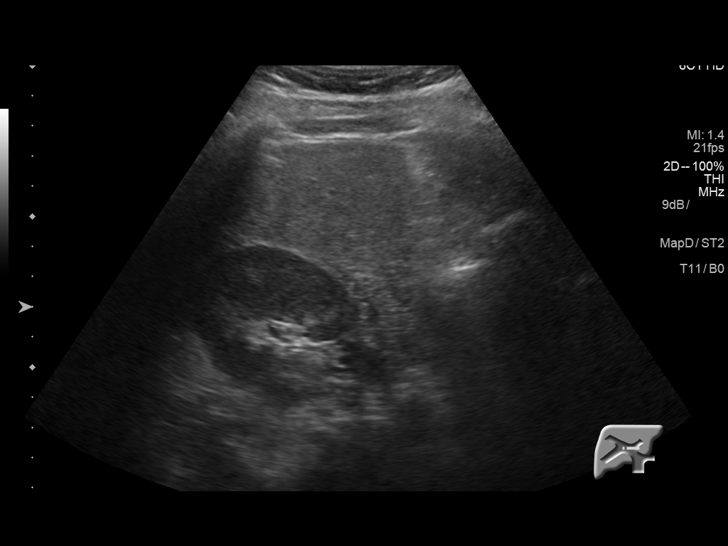
[im 31/46]
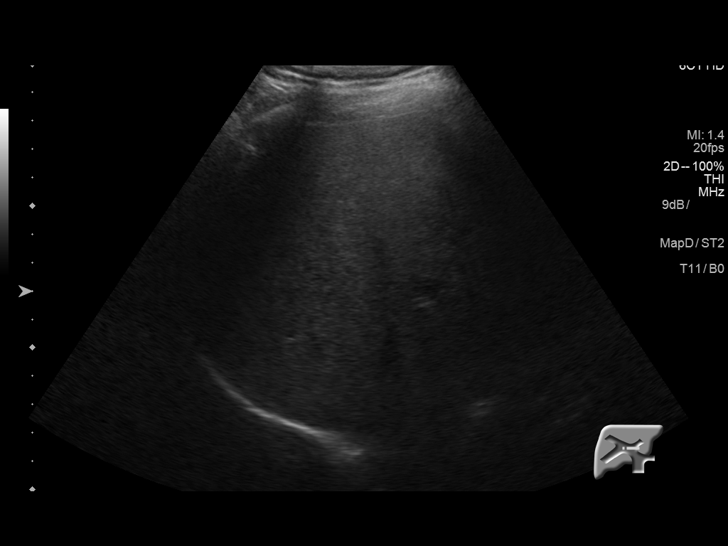
[im 34/46]
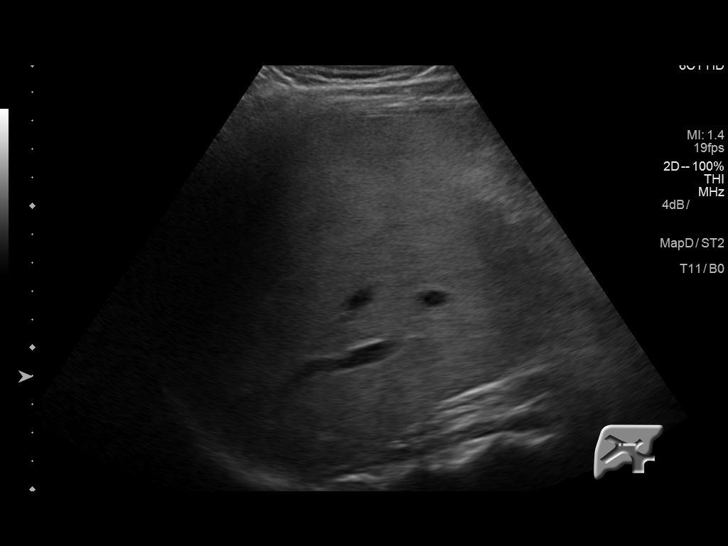
[im 38/46]
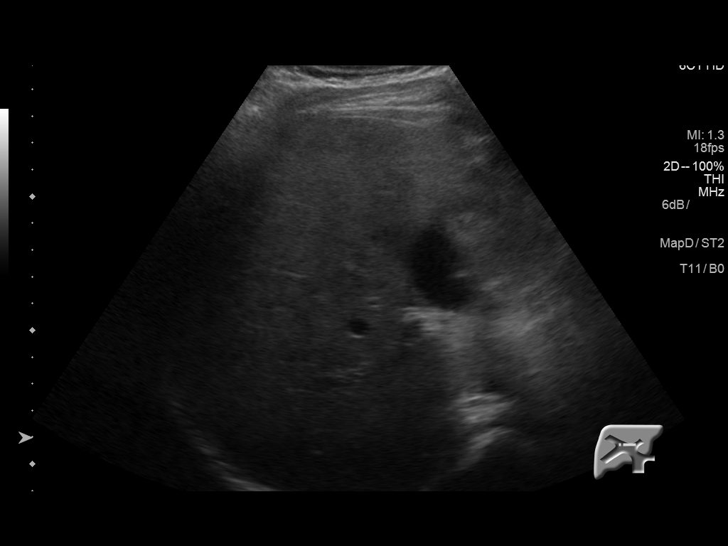
[im 42/46]
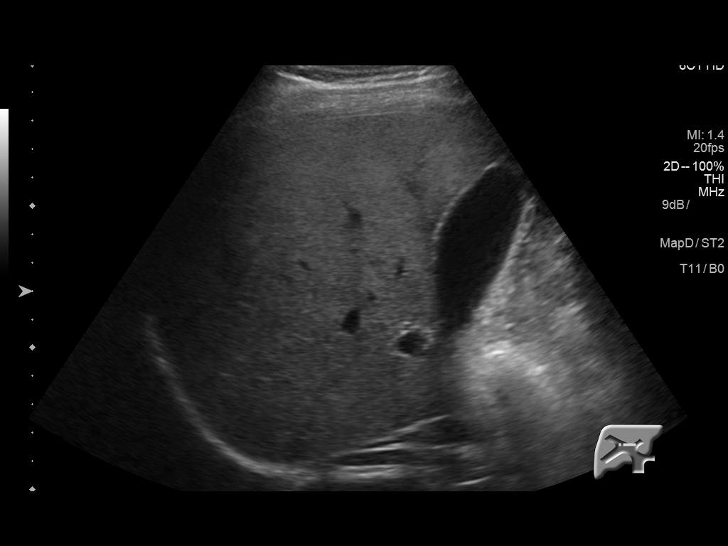
[im 46/46]
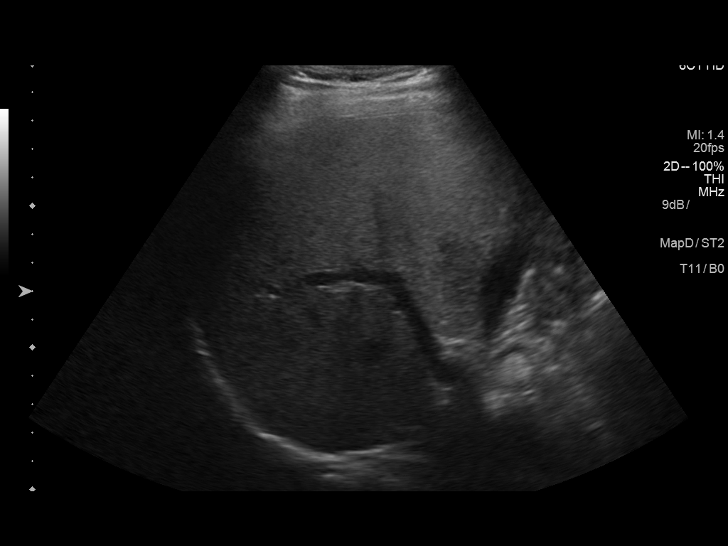

[14 of 25 positions shown; findings below may reference images not displayed]

FINDINGS: Gallbladder:

No gallstones or wall thickening visualized. No sonographic Murphy
sign noted by sonographer.

Common bile duct:

Diameter: 2.3 mm

Liver:

The liver is echogenic consistent fatty infiltration and/or
hepatocellular disease. 8 exam was limited as the patient was
combative.
IMPRESSION: 1. Limited exam . Liver is echogenic consistent with fatty
infiltration and/or hepatocellular disease. Similar findings noted
on prior exam. No focal hepatic abnormality.

2.  No gallstones or biliary distention.

## 2017-04-24 ENCOUNTER — Encounter: Payer: Self-pay | Admitting: Internal Medicine

## 2017-05-07 ENCOUNTER — Other Ambulatory Visit: Payer: Self-pay

## 2017-05-07 DIAGNOSIS — R7989 Other specified abnormal findings of blood chemistry: Secondary | ICD-10-CM

## 2017-05-07 DIAGNOSIS — R945 Abnormal results of liver function studies: Secondary | ICD-10-CM
